# Patient Record
Sex: Male | Born: 1995 | Race: White | Hispanic: No | Marital: Single | Smoking: Never smoker
Health system: Southern US, Community
[De-identification: ages and names within clinical notes are randomized; demographics above are authoritative.]

## PROBLEM LIST (undated history)

## (undated) DIAGNOSIS — E049 Nontoxic goiter, unspecified: Secondary | ICD-10-CM

## (undated) DIAGNOSIS — R51 Headache: Secondary | ICD-10-CM

## (undated) DIAGNOSIS — R6250 Unspecified lack of expected normal physiological development in childhood: Secondary | ICD-10-CM

## (undated) DIAGNOSIS — T7840XA Allergy, unspecified, initial encounter: Secondary | ICD-10-CM

## (undated) DIAGNOSIS — E039 Hypothyroidism, unspecified: Secondary | ICD-10-CM

## (undated) DIAGNOSIS — E300 Delayed puberty: Secondary | ICD-10-CM

## (undated) DIAGNOSIS — E063 Autoimmune thyroiditis: Secondary | ICD-10-CM

## (undated) HISTORY — PX: TONSILECTOMY, ADENOIDECTOMY, BILATERAL MYRINGOTOMY AND TUBES: SHX2538

## (undated) HISTORY — DX: Autoimmune thyroiditis: E06.3

## (undated) HISTORY — DX: Hypothyroidism, unspecified: E03.9

## (undated) HISTORY — DX: Delayed puberty: E30.0

## (undated) HISTORY — DX: Headache: R51

## (undated) HISTORY — DX: Allergy, unspecified, initial encounter: T78.40XA

## (undated) HISTORY — DX: Unspecified lack of expected normal physiological development in childhood: R62.50

## (undated) HISTORY — DX: Nontoxic goiter, unspecified: E04.9

## (undated) HISTORY — PX: ADENOIDECTOMY: SUR15

## (undated) HISTORY — PX: NASAL TURBINATE REDUCTION: SHX2072

---
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
## 2015-02-25 ENCOUNTER — Other Ambulatory Visit: Payer: Self-pay | Admitting: Sports Medicine

## 2015-06-18 DIAGNOSIS — J0100 Acute maxillary sinusitis, unspecified: Secondary | ICD-10-CM | POA: Diagnosis not present

## 2015-07-07 ENCOUNTER — Encounter: Payer: Self-pay | Admitting: Sports Medicine

## 2015-07-07 ENCOUNTER — Ambulatory Visit (INDEPENDENT_AMBULATORY_CARE_PROVIDER_SITE_OTHER): Payer: BLUE CROSS/BLUE SHIELD | Admitting: Sports Medicine

## 2015-07-07 VITALS — BP 111/69 | HR 68 | Temp 97.9°F | Wt 128.0 lb

## 2015-07-07 DIAGNOSIS — H6123 Impacted cerumen, bilateral: Secondary | ICD-10-CM

## 2015-07-07 DIAGNOSIS — J0101 Acute recurrent maxillary sinusitis: Secondary | ICD-10-CM | POA: Diagnosis not present

## 2015-07-07 MED ORDER — PREDNISONE 50 MG PO TABS
50.0000 mg | ORAL_TABLET | Freq: Every day | ORAL | Status: DC
Start: 2015-07-07 — End: 2015-07-15

## 2015-07-07 MED ORDER — AZITHROMYCIN 250 MG PO TABS: ORAL_TABLET | ORAL | Status: DC

## 2015-07-07 NOTE — Assessment & Plan Note (Signed)
Failed Augmentin with recurrence, adding azithromycin and prednisone.

## 2015-07-07 NOTE — Progress Notes (Signed)
  Subjective:    CC: Sinus infection  HPI: This is a pleasant 20 year old male, he was treated approximately 2 weeks ago for maxillary sinusitis with Augmentin, this was after approximately 2 weeks of symptoms, unfortunately had a recurrence, right-sided facial pressure and pain with radiation to the ear. Symptoms are moderate, persistent, no constitutional symptoms.  Past medical history, Surgical history, Family history not pertinant except as noted below, Social history, Allergies, and medications have been entered into the medical record, reviewed, and no changes needed.   Review of Systems: No fevers, chills, night sweats, weight loss, chest pain, or shortness of breath.   Objective:    General: Well Developed, well nourished, and in no acute distress.  Neuro: Alert and oriented x3, extra-ocular muscles intact, sensation grossly intact.  HEENT: Normocephalic, atraumatic, pupils equal round reactive to light, neck supple, no masses, no lymphadenopathy, thyroid nonpalpable. Oropharynx, nasopharynx unremarkable, ear canals shows cerumen impaction bilaterally Skin: Warm and dry, no rashes. Cardiac: Regular rate and rhythm, no murmurs rubs or gallops, no lower extremity edema.  Respiratory: Clear to auscultation bilaterally. Not using accessory muscles, speaking in full sentences.  Indication: Cerumen impaction of the left and right ear(s) Medical necessity statement: On physical examination, cerumen impairs clinically significant portions of the external auditory canal, and tympanic membrane. Noted obstructive, copious cerumen that cannot be removed without magnification and instrumentations requiring physician skills Consent: Discussed benefits and risks of procedure and verbal consent obtained Procedure: Patient was prepped for the procedure. Utilized an otoscope to assess and take note of the ear canal, the tympanic membrane, and the presence, amount, and placement of the cerumen. Gentle  water irrigation and soft plastic curette was utilized to remove cerumen.  Post procedure examination: shows cerumen was completely removed. Patient tolerated procedure well. The patient is made aware that they may experience temporary vertigo, temporary hearing loss, and temporary discomfort. If these symptom last for more than 24 hours to call the clinic or proceed to the ED.  Impression and Recommendations:

## 2015-07-08 ENCOUNTER — Ambulatory Visit: Payer: Self-pay | Admitting: Sports Medicine

## 2015-07-09 ENCOUNTER — Ambulatory Visit: Payer: Self-pay | Admitting: Sports Medicine

## 2015-07-14 ENCOUNTER — Other Ambulatory Visit: Payer: Self-pay | Admitting: Sports Medicine

## 2015-07-15 ENCOUNTER — Ambulatory Visit (INDEPENDENT_AMBULATORY_CARE_PROVIDER_SITE_OTHER): Payer: BLUE CROSS/BLUE SHIELD

## 2015-07-15 ENCOUNTER — Ambulatory Visit (INDEPENDENT_AMBULATORY_CARE_PROVIDER_SITE_OTHER): Payer: BLUE CROSS/BLUE SHIELD | Admitting: Sports Medicine

## 2015-07-15 ENCOUNTER — Encounter: Payer: Self-pay | Admitting: Sports Medicine

## 2015-07-15 DIAGNOSIS — J0101 Acute recurrent maxillary sinusitis: Secondary | ICD-10-CM | POA: Diagnosis not present

## 2015-07-15 DIAGNOSIS — J321 Chronic frontal sinusitis: Secondary | ICD-10-CM

## 2015-07-15 DIAGNOSIS — E038 Other specified hypothyroidism: Secondary | ICD-10-CM | POA: Diagnosis not present

## 2015-07-15 DIAGNOSIS — J329 Chronic sinusitis, unspecified: Secondary | ICD-10-CM | POA: Diagnosis not present

## 2015-07-15 DIAGNOSIS — E063 Autoimmune thyroiditis: Secondary | ICD-10-CM

## 2015-07-15 MED ORDER — TRAMADOL HCL 50 MG PO TABS: ORAL_TABLET | ORAL | Status: DC

## 2015-07-15 NOTE — Assessment & Plan Note (Signed)
Rechecking TSH 

## 2015-07-15 NOTE — Assessment & Plan Note (Signed)
Persistent symptoms now despite steroids, Augmentin, azithromycin, we need to confirm the diagnosis now, CT maxillofacial. I'm also going to give him some tramadol for pain relief. Interestingly he felt no better on the prednisone. I do suspect we will need referral to ENT.

## 2015-07-15 NOTE — Progress Notes (Signed)
  Subjective:    CC: Facial pain  HPI: For the past several months this pleasant 20 year old male has had pain that he localizes on both maxillary sinuses, frontal sinuses, we have done a course of Augmentin, and subsequently prednisone and azithromycin without any improvement in his symptoms at all, not even on the prednisone. On further questioning he does get some nausea with his facial pain, it is not throbbing and it lasts for hours to days, no photophobia or phonophobia, no aura. Symptoms are recurrent, severe.  Past medical history, Surgical history, Family history not pertinant except as noted below, Social history, Allergies, and medications have been entered into the medical record, reviewed, and no changes needed.   Review of Systems: No fevers, chills, night sweats, weight loss, chest pain, or shortness of breath.   Objective:    General: Well Developed, well nourished, and in no acute distress.  Neuro: Alert and oriented x3, extra-ocular muscles intact, sensation grossly intact. Cranial nerves II through XII are intact, motor, sensory, and coordinative functions are all intact.  HEENT: Normocephalic, atraumatic, pupils equal round reactive to light, neck supple, no masses, no lymphadenopathy, thyroid nonpalpable.  Skin: Warm and dry, no rashes. Cardiac: Regular rate and rhythm, no murmurs rubs or gallops, no lower extremity edema.  Respiratory: Clear to auscultation bilaterally. Not using accessory muscles, speaking in full sentences.  Impression and Recommendations:    I spent 25 minutes with this patient, greater than 50% was face-to-face time counseling regarding the above diagnoses

## 2015-07-16 ENCOUNTER — Encounter: Payer: Self-pay | Admitting: Sports Medicine

## 2015-07-16 DIAGNOSIS — J0110 Acute frontal sinusitis, unspecified: Secondary | ICD-10-CM

## 2015-07-16 LAB — TSH: TSH: 1.38 mIU/L (ref 0.40–4.50)

## 2015-07-16 MED ORDER — LEVOFLOXACIN 750 MG PO TABS: 750.0000 mg | ORAL_TABLET | Freq: Every day | ORAL | Status: DC

## 2015-07-16 MED ORDER — PREDNISONE 10 MG (21) PO TBPK: ORAL_TABLET | ORAL | Status: DC

## 2015-07-16 NOTE — Assessment & Plan Note (Signed)
CT shows a right frontal sinusitis, as well as deviation of the nasal septum, At this point we are going to try levofloxacin, a Dosepak of prednisone, and referral to ENT. Patient needs to get a copy of his CT disc and report to take to his ENT appointment.

## 2015-08-18 DIAGNOSIS — J3489 Other specified disorders of nose and nasal sinuses: Secondary | ICD-10-CM | POA: Diagnosis not present

## 2015-08-18 DIAGNOSIS — J302 Other seasonal allergic rhinitis: Secondary | ICD-10-CM | POA: Diagnosis not present

## 2015-08-18 DIAGNOSIS — J321 Chronic frontal sinusitis: Secondary | ICD-10-CM | POA: Diagnosis not present

## 2015-12-29 ENCOUNTER — Other Ambulatory Visit: Payer: Self-pay | Admitting: Sports Medicine

## 2015-12-29 DIAGNOSIS — E063 Autoimmune thyroiditis: Secondary | ICD-10-CM

## 2016-10-25 ENCOUNTER — Other Ambulatory Visit: Payer: Self-pay | Admitting: Sports Medicine

## 2017-01-03 DIAGNOSIS — J342 Deviated nasal septum: Secondary | ICD-10-CM | POA: Diagnosis not present

## 2017-01-03 DIAGNOSIS — J320 Chronic maxillary sinusitis: Secondary | ICD-10-CM | POA: Diagnosis not present

## 2017-01-03 DIAGNOSIS — J302 Other seasonal allergic rhinitis: Secondary | ICD-10-CM | POA: Diagnosis not present

## 2017-02-04 DIAGNOSIS — J342 Deviated nasal septum: Secondary | ICD-10-CM | POA: Diagnosis not present

## 2017-02-04 DIAGNOSIS — J320 Chronic maxillary sinusitis: Secondary | ICD-10-CM | POA: Diagnosis not present

## 2017-02-20 DIAGNOSIS — R112 Nausea with vomiting, unspecified: Secondary | ICD-10-CM | POA: Diagnosis not present

## 2017-02-20 DIAGNOSIS — M791 Myalgia, unspecified site: Secondary | ICD-10-CM | POA: Diagnosis not present

## 2017-02-22 ENCOUNTER — Ambulatory Visit: Payer: BLUE CROSS/BLUE SHIELD | Admitting: Physician Assistant

## 2017-02-22 ENCOUNTER — Encounter: Payer: Self-pay | Admitting: Sports Medicine

## 2017-02-22 ENCOUNTER — Ambulatory Visit (INDEPENDENT_AMBULATORY_CARE_PROVIDER_SITE_OTHER): Payer: BLUE CROSS/BLUE SHIELD | Admitting: Sports Medicine

## 2017-02-22 DIAGNOSIS — J111 Influenza due to unidentified influenza virus with other respiratory manifestations: Secondary | ICD-10-CM | POA: Insufficient documentation

## 2017-02-22 DIAGNOSIS — R69 Illness, unspecified: Secondary | ICD-10-CM

## 2017-02-22 LAB — POCT INFLUENZA A/B
Influenza A, POC: NEGATIVE
Influenza B, POC: NEGATIVE

## 2017-02-22 MED ORDER — OSELTAMIVIR PHOSPHATE 75 MG PO CAPS
75.0000 mg | ORAL_CAPSULE | Freq: Two times a day (BID) | ORAL | 0 refills | Status: DC
Start: 2017-02-22 — End: 2017-08-09

## 2017-02-22 NOTE — Addendum Note (Signed)
Addended by: Nancy FetterRAWFORD, Leonardo Plaia J on: 02/22/2017 02:55 PM   Modules accepted: Orders

## 2017-02-22 NOTE — Assessment & Plan Note (Signed)
DayQuil/NyQuil, Tamiflu.

## 2017-02-22 NOTE — Patient Instructions (Signed)

## 2017-02-22 NOTE — Progress Notes (Signed)
  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  Just over 2 days ago he started to feel sore throat, fevers and chills, muscle aches, body aches, mild nausea vomiting, no diarrhea.  No skin rash.  Multiple sick contacts in the dorm room.  Symptoms are moderate, persistent, no chest pain, no shortness of breath.  I reviewed the past medical history, family history, social history, surgical history, and allergies today and no changes were needed.  Please see the problem list section below in epic for further details.  Past Medical History: Past Medical History:  Diagnosis Date  . Allergy   . Goiter   . Headache(784.0)   . Hypothyroid   . Hypothyroidism, acquired, autoimmune   . Migraine   . Physical growth delay   . Puberty delay   . Thyroiditis, autoimmune    Past Surgical History: Past Surgical History:  Procedure Laterality Date  . ADENOIDECTOMY    . NASAL TURBINATE REDUCTION    . TONSILECTOMY, ADENOIDECTOMY, BILATERAL MYRINGOTOMY AND TUBES     Social History: Social History   Socioeconomic History  . Marital status: Single    Spouse name: None  . Number of children: None  . Years of education: None  . Highest education level: None  Social Needs  . Financial resource strain: None  . Food insecurity - worry: None  . Food insecurity - inability: None  . Transportation needs - medical: None  . Transportation needs - non-medical: None  Occupational History  . None  Tobacco Use  . Smoking status: Never Smoker  . Smokeless tobacco: Never Used  Substance and Sexual Activity  . Alcohol use: No  . Drug use: None  . Sexual activity: No  Other Topics Concern  . None  Social History Narrative  . None   Family History: Family History  Problem Relation Age of Onset  . Hypothyroidism Mother   . Migraines Mother   . Allergies Mother   . Cancer Mother   . Cancer Father   . Allergies Father     . Hypothyroidism Sister   . Migraines Sister   . Allergies Sister   . Thyroid disease Sister   . Thyroid disease Maternal Aunt   . Cancer Maternal Grandmother   . Thyroid disease Maternal Grandfather    Allergies: Allergies  Allergen Reactions  . Cyproheptadine    Medications: See med rec.  Review of Systems: No fevers, chills, night sweats, weight loss, chest pain, or shortness of breath.   Objective:    General: Well Developed, well nourished, and in no acute distress.  Neuro: Alert and oriented x3, extra-ocular muscles intact, sensation grossly intact.  HEENT: Normocephalic, atraumatic, pupils equal round reactive to light, neck supple, no masses, no lymphadenopathy, thyroid nonpalpable.  Oropharynx, nasopharynx, ear canals unremarkable. Skin: Warm and dry, no rashes. Cardiac: Regular rate and rhythm, no murmurs rubs or gallops, no lower extremity edema.  Respiratory: Clear to auscultation bilaterally. Not using accessory muscles, speaking in full sentences.  Impression and Recommendations:    Influenza-like illness DayQuil/NyQuil, Tamiflu.  I spent 25 minutes with this patient, greater than 50% was face-to-face time counseling regarding the above diagnoses ___________________________________________ Ihor Austinhomas J. Benjamin Stainhekkekandam, M.D., ABFM., CAQSM. Primary Care and Sports Medicine [place] [facility]  Adjunct Instructor of Family Medicine  [employer]

## 2017-03-27 IMAGING — CT CT PARANASAL SINUSES LIMITED
1 series · 10 of 12 positions shown, 13 images · non-contrast
Comparison: None.

CLINICAL DATA: Chronic sinusitis.

EXAM:
CT PARANASAL SINUS LIMITED WITHOUT CONTRAST
TECHNIQUE: Non-contiguous multidetector CT images of the paranasal sinuses were
obtained in a single plane without contrast.

[Series 3: limited sinus st · axial · 0.27mm/px · z∈[-83,+7]mm · 10 of 12 slices shown, 13 images]
[im 2/12  brain]
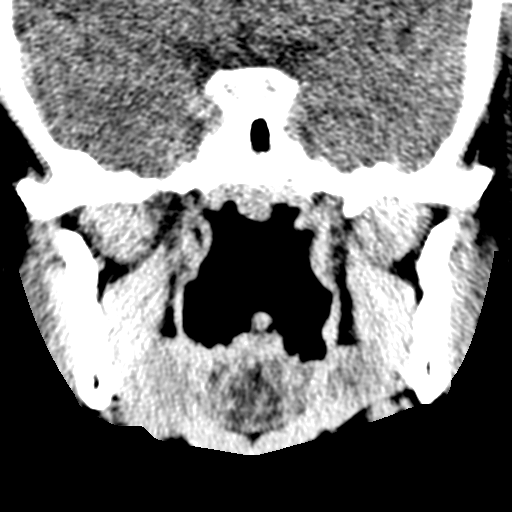
[im 2/12  bone]
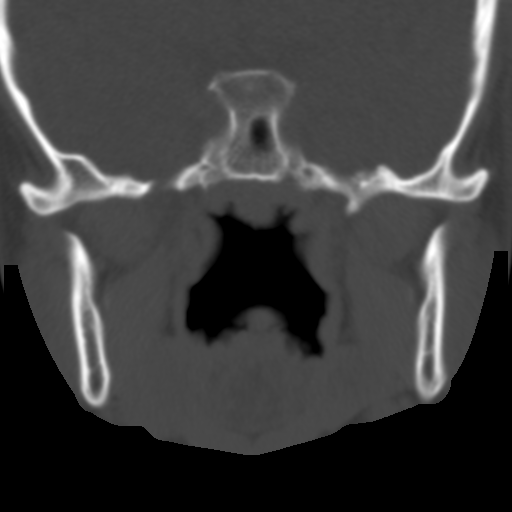
[im 3/12  bone]
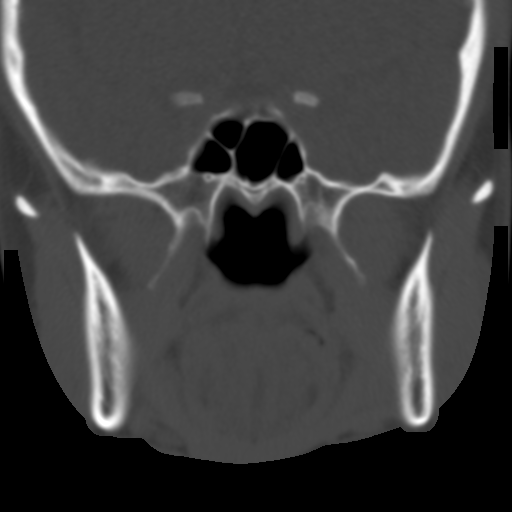
[im 4/12  bone]
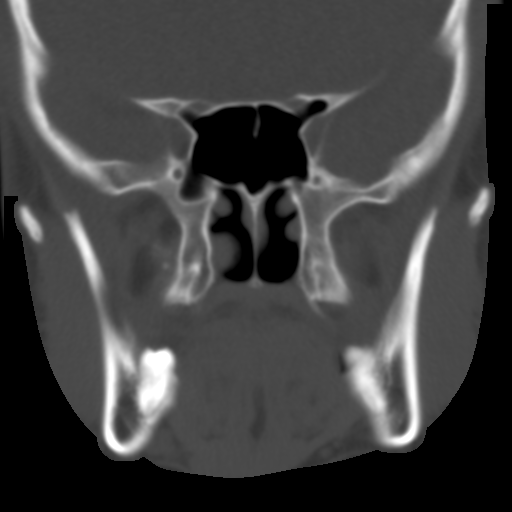
[im 5/12  bone]
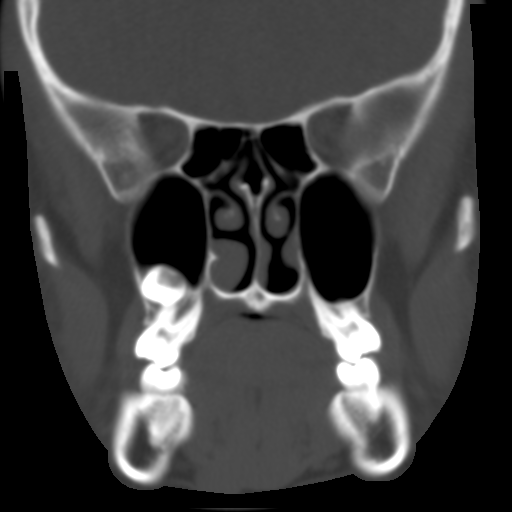
[im 6/12  brain]
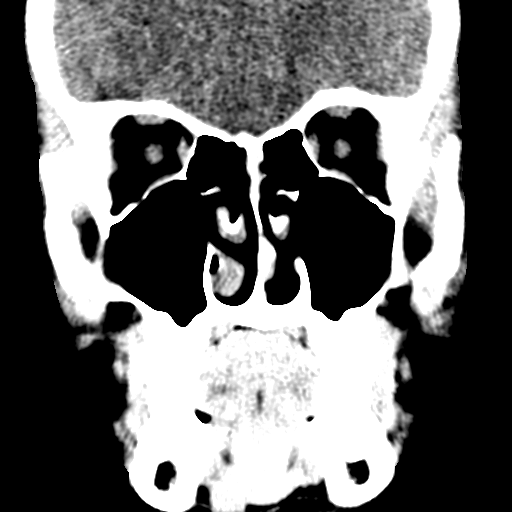
[im 6/12  bone]
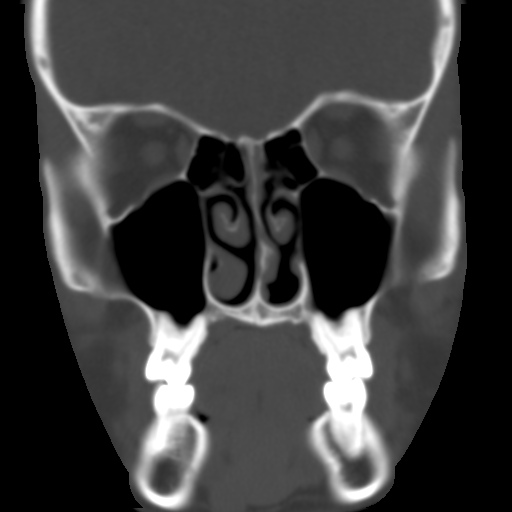
[im 7/12  bone]
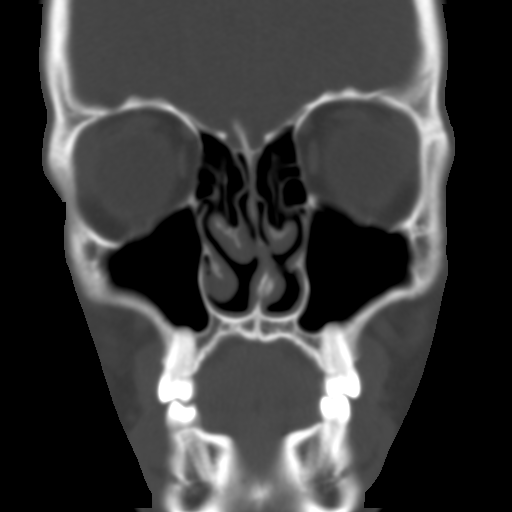
[im 8/12  bone]
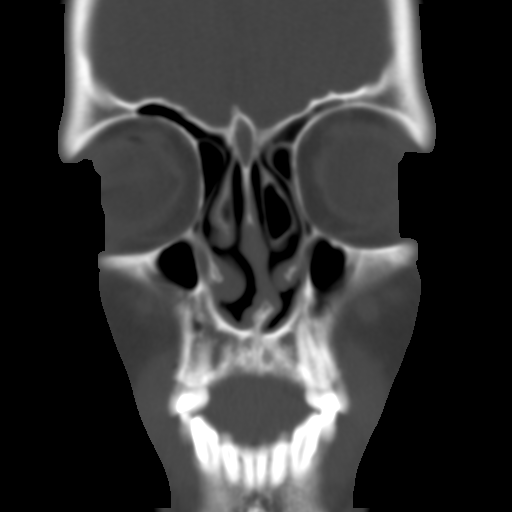
[im 9/12  bone]
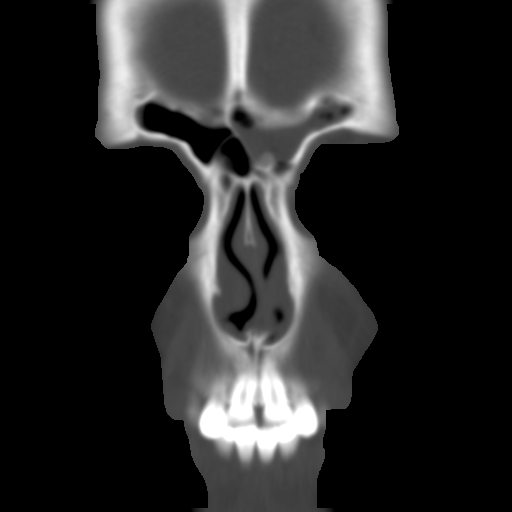
[im 10/12  brain]
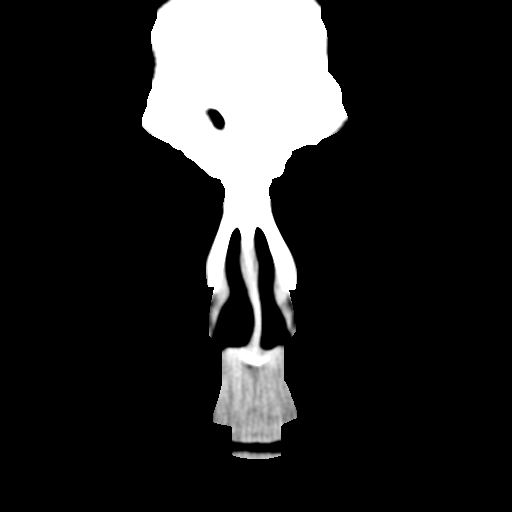
[im 10/12  bone]
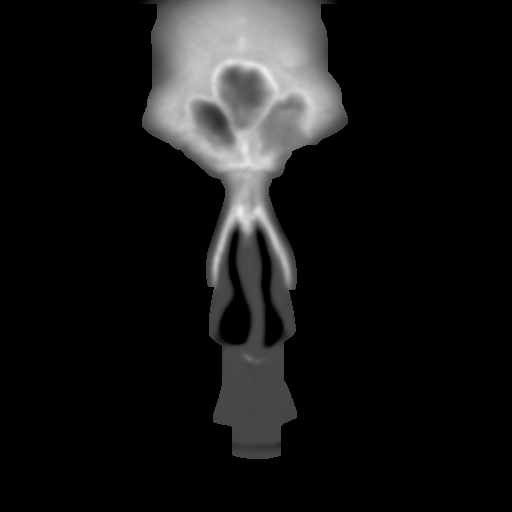
[im 11/12  bone]
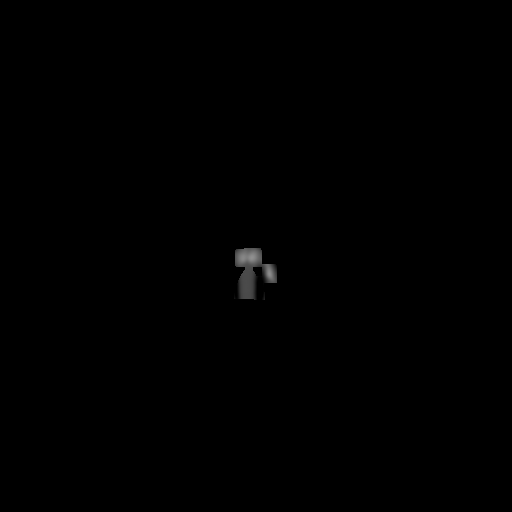

[10 of 12 positions shown; findings below may reference images not displayed]

FINDINGS: Right frontal sinusitis is noted. Remaining paranasal sinuses appear
normal. Globes and orbits appear normal. Mild deviation of bony
nasal septum to the right is noted.
IMPRESSION: Right frontal sinusitis.

## 2017-05-02 ENCOUNTER — Other Ambulatory Visit: Payer: Self-pay | Admitting: Sports Medicine

## 2017-08-09 ENCOUNTER — Encounter: Payer: Self-pay | Admitting: Sports Medicine

## 2017-08-09 ENCOUNTER — Ambulatory Visit (INDEPENDENT_AMBULATORY_CARE_PROVIDER_SITE_OTHER): Payer: BLUE CROSS/BLUE SHIELD | Admitting: Sports Medicine

## 2017-08-09 ENCOUNTER — Ambulatory Visit (INDEPENDENT_AMBULATORY_CARE_PROVIDER_SITE_OTHER): Payer: BLUE CROSS/BLUE SHIELD

## 2017-08-09 DIAGNOSIS — S93402A Sprain of unspecified ligament of left ankle, initial encounter: Secondary | ICD-10-CM | POA: Insufficient documentation

## 2017-08-09 DIAGNOSIS — S93492A Sprain of other ligament of left ankle, initial encounter: Secondary | ICD-10-CM

## 2017-08-09 DIAGNOSIS — X58XXXA Exposure to other specified factors, initial encounter: Secondary | ICD-10-CM | POA: Diagnosis not present

## 2017-08-09 DIAGNOSIS — Z0000 Encounter for general adult medical examination without abnormal findings: Secondary | ICD-10-CM

## 2017-08-09 DIAGNOSIS — E063 Autoimmune thyroiditis: Secondary | ICD-10-CM | POA: Diagnosis not present

## 2017-08-09 NOTE — Progress Notes (Signed)
Subjective:    CC: Ankle pain  HPI: Paul [name] is a pleasant 22 year old male, for the past several months he has had several inversion injuries of his left ankle, it swells often, and at this point he cannot run or jump without significant pain.  Symptoms are moderate, persistent, localized to the lateral ankle without radiation.  Hypothyroidism: Due to TFTs.  I reviewed the past medical history, family history, social history, surgical history, and allergies today and no changes were needed.  Please see the problem list section below in epic for further details.  Past Medical History: Past Medical History:  Diagnosis Date  . Allergy   . Goiter   . Headache(784.0)   . Hypothyroid   . Hypothyroidism, acquired, autoimmune   . Migraine   . Physical growth delay   . Puberty delay   . Thyroiditis, autoimmune    Past Surgical History: Past Surgical History:  Procedure Laterality Date  . ADENOIDECTOMY    . NASAL TURBINATE REDUCTION    . TONSILECTOMY, ADENOIDECTOMY, BILATERAL MYRINGOTOMY AND TUBES     Social History: Social History   Socioeconomic History  . Marital status: Single    Spouse name: Not on file  . Number of children: Not on file  . Years of education: Not on file  . Highest education level: Not on file  Occupational History  . Not on file  Social Needs  . Financial resource strain: Not on file  . Food insecurity:    Worry: Not on file    Inability: Not on file  . Transportation needs:    Medical: Not on file    Non-medical: Not on file  Tobacco Use  . Smoking status: Never Smoker  . Smokeless tobacco: Never Used  Substance and Sexual Activity  . Alcohol use: No  . Drug use: Not on file  . Sexual activity: Never  Lifestyle  . Physical activity:    Days per week: Not on file    Minutes per session: Not on file  . Stress: Not on file  Relationships  . Social connections:    Talks on phone: Not on file    Gets together: Not on file    Attends  religious service: Not on file    Active member of club or organization: Not on file    Attends meetings of clubs or organizations: Not on file    Relationship status: Not on file  Other Topics Concern  . Not on file  Social History Narrative  . Not on file   Family History: Family History  Problem Relation Age of Onset  . Hypothyroidism Mother   . Migraines Mother   . Allergies Mother   . Cancer Mother   . Cancer Father   . Allergies Father   . Hypothyroidism Sister   . Migraines Sister   . Allergies Sister   . Thyroid disease Sister   . Thyroid disease Maternal Aunt   . Cancer Maternal Grandmother   . Thyroid disease Maternal Grandfather    Allergies: Allergies  Allergen Reactions  . Cyproheptadine    Medications: See med rec.  Review of Systems: No fevers, chills, night sweats, weight loss, chest pain, or shortness of breath.   Objective:    General: Well Developed, well nourished, and in no acute distress.  Neuro: Alert and oriented x3, extra-ocular muscles intact, sensation grossly intact.  HEENT: Normocephalic, atraumatic, pupils equal round reactive to light, neck supple, no masses, no lymphadenopathy, thyroid nonpalpable.  Skin: Warm  and dry, no rashes. Cardiac: Regular rate and rhythm, no murmurs rubs or gallops, no lower extremity edema.  Respiratory: Clear to auscultation bilaterally. Not using accessory muscles, speaking in full sentences. Left ankle: No visible erythema or swelling. Range of motion is full in all directions. Strength is 5/5 in all directions. Stable lateral and medial ligaments; squeeze test and kleiger test unremarkable; Talar dome nontender; No pain at base of 5th MT; No tenderness over cuboid; No tenderness over N spot or navicular prominence No tenderness on posterior aspects of lateral and medial malleolus Tender over the ATFL and CFL. No sign of peroneal tendon subluxations; Negative tarsal tunnel tinel's Able to walk 4  steps.  Impression and Recommendations:    Sprain of ankle, left Recurrent lateral ankle sprains, ASO, physical therapy, x-rays. Return in 1 month, MRI if no better.  Hypothyroidism, acquired, autoimmune Rechecking TFTs  Health maintenance examination Adding routine labs ___________________________________________ Ihor Austinhomas J. Benjamin Stainhekkekandam, M.D., ABFM., CAQSM. Primary Care and Sports Medicine [facility]  Adjunct Instructor of Family Medicine  [employer]

## 2017-08-09 NOTE — Assessment & Plan Note (Signed)
Adding routine labs 

## 2017-08-09 NOTE — Assessment & Plan Note (Signed)
Rechecking TFTs

## 2017-08-09 NOTE — Assessment & Plan Note (Signed)
Recurrent lateral ankle sprains, ASO, physical therapy, x-rays. Return in 1 month, MRI if no better.

## 2017-08-15 DIAGNOSIS — Z0000 Encounter for general adult medical examination without abnormal findings: Secondary | ICD-10-CM | POA: Diagnosis not present

## 2017-08-15 DIAGNOSIS — E063 Autoimmune thyroiditis: Secondary | ICD-10-CM | POA: Diagnosis not present

## 2017-08-15 DIAGNOSIS — Z131 Encounter for screening for diabetes mellitus: Secondary | ICD-10-CM | POA: Diagnosis not present

## 2017-08-16 LAB — COMPREHENSIVE METABOLIC PANEL
AG Ratio: 2.1 (calc) (ref 1.0–2.5)
ALT: 14 U/L (ref 9–46)
AST: 19 U/L (ref 10–40)
Albumin: 4.9 g/dL (ref 3.6–5.1)
Alkaline phosphatase (APISO): 64 U/L (ref 40–115)
BUN: 16 mg/dL (ref 7–25)
CO2: 28 mmol/L (ref 20–32)
Calcium: 9.9 mg/dL (ref 8.6–10.3)
Chloride: 104 mmol/L (ref 98–110)
Creat: 0.86 mg/dL (ref 0.60–1.35)
Globulin: 2.3 g/dL (calc) (ref 1.9–3.7)
Glucose, Bld: 96 mg/dL (ref 65–139)
Potassium: 4.7 mmol/L (ref 3.5–5.3)
Sodium: 138 mmol/L (ref 135–146)
Total Bilirubin: 1 mg/dL (ref 0.2–1.2)
Total Protein: 7.2 g/dL (ref 6.1–8.1)

## 2017-08-16 LAB — LIPID PANEL W/REFLEX DIRECT LDL
Cholesterol: 194 mg/dL (ref <200)
HDL: 71 mg/dL (ref >40)
LDL Cholesterol (Calc): 106 mg/dL (calc) — ABNORMAL HIGH
Non-HDL Cholesterol (Calc): 123 mg/dL (calc) (ref <130)
Total CHOL/HDL Ratio: 2.7 (calc) (ref <5.0)
Triglycerides: 80 mg/dL (ref <150)

## 2017-08-16 LAB — CBC
HCT: 36.9 % — ABNORMAL LOW (ref 38.5–50.0)
Hemoglobin: 12.8 g/dL — ABNORMAL LOW (ref 13.2–17.1)
MCH: 30.3 pg (ref 27.0–33.0)
MCHC: 34.7 g/dL (ref 32.0–36.0)
MCV: 87.4 fL (ref 80.0–100.0)
MPV: 9.1 fL (ref 7.5–12.5)
Platelets: 247 10*3/uL (ref 140–400)
RBC: 4.22 10*6/uL (ref 4.20–5.80)
RDW: 12.7 % (ref 11.0–15.0)
WBC: 5.7 10*3/uL (ref 3.8–10.8)

## 2017-08-16 LAB — HEMOGLOBIN A1C
Hgb A1c MFr Bld: 5.3 % of total Hgb (ref <5.7)
Mean Plasma Glucose: 105 (calc)
eAG (mmol/L): 5.8 (calc)

## 2017-08-16 LAB — HIV ANTIBODY (ROUTINE TESTING W REFLEX): HIV 1&2 Ab, 4th Generation: NONREACTIVE

## 2017-08-16 LAB — TSH: TSH: 1.05 mIU/L (ref 0.40–4.50)

## 2017-08-18 ENCOUNTER — Ambulatory Visit (INDEPENDENT_AMBULATORY_CARE_PROVIDER_SITE_OTHER): Payer: BLUE CROSS/BLUE SHIELD | Admitting: Rehabilitative and Restorative Service Providers"

## 2017-08-18 ENCOUNTER — Encounter: Payer: Self-pay | Admitting: Rehabilitative and Restorative Service Providers"

## 2017-08-18 DIAGNOSIS — M6281 Muscle weakness (generalized): Secondary | ICD-10-CM

## 2017-08-18 DIAGNOSIS — M25572 Pain in left ankle and joints of left foot: Secondary | ICD-10-CM

## 2017-08-18 DIAGNOSIS — R29898 Other symptoms and signs involving the musculoskeletal system: Secondary | ICD-10-CM

## 2017-08-18 NOTE — Therapy (Signed)
[facility] [address] [place], [place], [address] Phone: [phone]   Fax:  [phone]  Physical Therapy Evaluation  Patient Details  Name: Paul DameRobert Allen [name] MRN: [id number] Date of Birth: 12/07/1995 Referring Provider: Dr Benjamin Stainhekkekandam   Encounter Date: 08/18/2017  PT End of Session - 08/18/17 1133    Visit Number  1    Number of Visits  12    Date for PT Re-Evaluation  09/28/17    PT Start Time  1021    PT Stop Time  1124    PT Time Calculation (min)  63 min    Activity Tolerance  Patient tolerated treatment well       Past Medical History:  Diagnosis Date  . Allergy   . Goiter   . Headache(784.0)   . Hypothyroid   . Hypothyroidism, acquired, autoimmune   . Migraine   . Physical growth delay   . Puberty delay   . Thyroiditis, autoimmune     Past Surgical History:  Procedure Laterality Date  . ADENOIDECTOMY    . NASAL TURBINATE REDUCTION    . TONSILECTOMY, ADENOIDECTOMY, BILATERAL MYRINGOTOMY AND TUBES      There were no vitals filed for this visit.   Subjective Assessment - 08/18/17 1029    Subjective  Patient reports that he has had ~ 4 Lt ankle sprains with the initial injury ~ 4 years ago and most recent late April. He has not been able to return to full activity level - specifically running, jumping, playing basketball due to pain.     Patient Stated Goals  get ankle back to full activity level     Currently in Pain?  No/denies    Pain Score  0-No pain    Pain Location  Ankle    Pain Orientation  Left    Pain Type  Chronic pain    Pain Onset  More than a month ago    Pain Frequency  Intermittent    Aggravating Factors   running, jumping, basketball, plant and turn, softball, squatting, walking/running on inclines     Pain Relieving Factors  time, rest          [facility] PT Assessment - 08/18/17 0001      Assessment   Medical Diagnosis  Recurrent Lt ankle sprains    Referring Provider   Dr Benjamin Stainhekkekandam    Onset Date/Surgical Date  05/02/17 initial sprain ~ 4 yrs ago     Hand Dominance  Right    Next MD Visit  1 month after PT     Prior Therapy  none       Precautions   Precautions  None      Restrictions   Weight Bearing Restrictions  No      Balance Screen   Has the patient fallen in the past 6 months  No    Has the patient had a decrease in activity level because of a fear of falling?   No    Is the patient reluctant to leave their home because of a fear of falling?   No      Prior Function   Level of Independence  Independent    Vocation  Full time employment;Student internship with [employer] finishes 09/04/17    Vocation Requirements  marketing and sports marketing     Leisure  golf; basketball; softball; yard work       Observation/Other Assessments   Focus on Therapeutic Outcomes (FOTO)  405 limitation       Posture/Postural Control   Posture Comments  head forward; shoulders rounded       AROM   Right/Left Hip  -- WNL's bilat     Right/Left Knee  -- WNL's bilat     Right Ankle Dorsiflexion  15    Right Ankle Plantar Flexion  54    Right Ankle Inversion  25    Right Ankle Eversion  13    Left Ankle Dorsiflexion  6    Left Ankle Plantar Flexion  45    Left Ankle Inversion  18    Left Ankle Eversion  15      Strength   Left Hip ABduction  4+/5    Right Ankle Dorsiflexion  5/5    Right Ankle Plantar Flexion  5/5    Right Ankle Inversion  5/5    Right Ankle Eversion  5/5    Left Ankle Dorsiflexion  -- 5-/5    Left Ankle Plantar Flexion  4+/5    Left Ankle Inversion  5/5    Left Ankle Eversion  -- 5-/5      Flexibility   Hamstrings  Rt tight 50 deg; Lt 55 deg     Quadriceps  WNL    ITB  WNL    Piriformis  WNL       Palpation   Palpation comment  tender Lt lateral ankle - anterior talofibular ligament                 Objective measurements completed on examination: See above findings.      [facility] Adult PT Treatment/Exercise -  08/18/17 0001      Vasopneumatic   Number Minutes Vasopneumatic   15 minutes    Vasopnuematic Location   Ankle Lt    Vasopneumatic Pressure  Medium    Vasopneumatic Temperature   34 deg       Ankle Exercises: Stretches   Soleus Stretch  2 reps;30 seconds    Gastroc Stretch  2 reps;20 seconds    Other Stretch  hamstring stretch 30 sec x 2 reps pt supine with strap       Ankle Exercises: Standing   SLS  30 sec x 3 reps each LE       Ankle Exercises: Supine   T-Band  4 way band 10 reps slow; 10 reps fast red TB (double for DF/PF; single for inv/eve              PT Education - 08/18/17 1117    Education Details  HEP    Person(s) Educated  Patient    Methods  Explanation;Demonstration;Tactile cues;Verbal cues;Handout    Comprehension  Verbalized understanding;Returned demonstration;Verbal cues required;Tactile cues required          PT Long Term Goals - 08/18/17 1221      PT LONG TERM GOAL #1   Title  5/5 strength Lt hip abductors and Lt ankle 09/28/17    Time  6    Period  Weeks    Status  New      PT LONG TERM GOAL #2   Title  Improve balance and proprioception with Rob to demonstrate SLS bilat for 20-30 sec without UE support 09/28/17    Time  6    Period  Weeks    Status  New      PT LONG TERM GOAL #3   Title  Independent in HEP 09/28/17    Time  6  Period  Weeks    Status  New      PT LONG TERM GOAL #4   Title  Rob reports return to sports with minimal to no limitations or pain 09/28/17    Time  6    Period  Weeks    Status  New      PT LONG TERM GOAL #5   Title  Improve FOTO to </= 21% limitation 09/28/17    Time  6    Period  Weeks    Status  New             Plan - 08/18/17 1134    Clinical Impression Statement  Rob presents with recurrent Lt ankle sprain with most recent sprain April 2019. He has continued pain and limitations with higher level activities and sports. He has limited Lt ankle ROM and strength as well as balance and  functional activities. Patient will benefit form PT to address problems identified.     Clinical Presentation  Stable    Clinical Decision Making  Low    Rehab Potential  Good    PT Frequency  2x / week    PT Duration  6 weeks    PT Treatment/Interventions  Patient/family education;ADLs/Self Care Home Management;Cryotherapy;Electrical Stimulation;Iontophoresis 4mg /ml Dexamethasone;Moist Heat;Ultrasound;Dry needling;Manual techniques;Neuromuscular re-education;Therapeutic activities;Therapeutic exercise;Balance training    PT Next Visit Plan  review HEP; progress with ROM/strengthening/balance/proprioception/sports specific activities - (add Lt hip abductor strengthening) modalities as indicated     Consulted and Agree with Plan of Care  Patient       Patient will benefit from skilled therapeutic intervention in order to improve the following deficits and impairments:  Decreased mobility, Decreased range of motion, Decreased strength, Decreased activity tolerance  Visit Diagnosis: Pain in left ankle and joints of left foot - Plan: PT plan of care cert/re-cert  Muscle weakness (generalized) - Plan: PT plan of care cert/re-cert  Other symptoms and signs involving the musculoskeletal system - Plan: PT plan of care cert/re-cert     Problem List Patient Active Problem List   Diagnosis Date Noted  . Sprain of ankle, left 08/09/2017  . Physical growth delay   . Hypothyroidism, acquired, autoimmune   . Multiple respiratory allergies 11/21/2010  . Environmental allergies 11/21/2010  . Migraine headache 11/21/2010  . Health maintenance examination 11/21/2010    Kamila Broda Rober Minion PT, MPH  08/18/2017, 12:26 PM  [facility] [address] [place], [place], [address] Phone: [phone]   Fax:  [phone]  Name: Shimshon Narula MRN: [id number] Date of Birth: January 29, 1995

## 2017-08-18 NOTE — Patient Instructions (Addendum)
HIP: Hamstrings - Supine  Place strap around foot. Raise leg up, keeping knee straight.  Bend opposite knee to protect back if indicated. Hold 30 seconds. 3 reps per set, 2-3 sets per day  Gastroc Stretch    Stand with right foot back, leg straight, forward leg bent. Keeping heel on floor, turned slightly out, lean into wall until stretch is felt in calf. Hold __30__ seconds. Repeat __3__ times per set. Do __2-3__ sessions per day.   Achilles / Soleus, Standing    Stand, right foot behind, heel on floor and turned slightly out. Lower hips and bend knees. Hold _30__ seconds. Repeat _3__ times per session. Do _2-3__ sessions per day.   Dorsiflexion: Resisted    Facing anchor, tubing around left foot, pull toward face.  Repeat ____ times per set. Do ____ sets per session. Do ____ sessions per day.   Eversion: Resisted    With right foot in tubing loop, hold tubing around other foot to resist and turn foot out. Repeat ____ times per set. Do ____ sets per session. Do ____ sessions per day.    Inversion: Resisted    Cross legs with right leg underneath, foot in tubing loop. Hold tubing around other foot to resist and turn foot in. Repeat ____ times per set. Do ____ sets per session. Do ____ sessions per day.   .  Plantar Flexion: Resisted    Anchor behind, tubing around left foot, press down. Repeat ____ times per set. Do ____ sets per session. Do ____ sessions per day.   Balance: Unilateral    Attempt to balance on left leg, eyes open. Hold __30__ seconds. Repeat _4-5___ times per set.  Do __2-3__ sessions per day. Perform exercise with eyes closed.

## 2017-08-23 ENCOUNTER — Ambulatory Visit (INDEPENDENT_AMBULATORY_CARE_PROVIDER_SITE_OTHER): Payer: BLUE CROSS/BLUE SHIELD | Admitting: Physical Therapy

## 2017-08-23 DIAGNOSIS — M6281 Muscle weakness (generalized): Secondary | ICD-10-CM

## 2017-08-23 DIAGNOSIS — M25572 Pain in left ankle and joints of left foot: Secondary | ICD-10-CM | POA: Diagnosis not present

## 2017-08-23 DIAGNOSIS — R29898 Other symptoms and signs involving the musculoskeletal system: Secondary | ICD-10-CM

## 2017-08-23 NOTE — Patient Instructions (Signed)
ANKLE: Eversion, Bilateral (Band)    Place band around feet. Keeping heels on floor, raise toes of both feet up and away from body. Do not move hips. Hold _2__ seconds. Use __blue______ band. __10_ reps per set, _2__ sets per day  * trim/ shave ankle

## 2017-08-23 NOTE — Therapy (Signed)
[address] [place], [place], [address] Phone: [phone]   Fax:  [phone]  Physical Therapy Treatment  Patient Details  Name: Paul [name] MRN: [id number] Date of Birth: [birth year] Referring Provider: Dr. Dianah Field   Encounter Date: 08/23/2017  PT End of Session - 08/23/17 1149    Visit Number  2    Number of Visits  12    Date for PT Re-Evaluation  09/28/17    PT Start Time  8003    PT Stop Time  4917    PT Time Calculation (min)  43 min    Activity Tolerance  Patient tolerated treatment well;No increased pain    Behavior During Therapy  WFL for tasks assessed/performed       Past Medical History:  Diagnosis Date  . Allergy   . Goiter   . Headache(784.0)   . Hypothyroid   . Hypothyroidism, acquired, autoimmune   . Migraine   . Physical growth delay   . Puberty delay   . Thyroiditis, autoimmune     Past Surgical History:  Procedure Laterality Date  . ADENOIDECTOMY    . NASAL TURBINATE REDUCTION    . TONSILECTOMY, ADENOIDECTOMY, BILATERAL MYRINGOTOMY AND TUBES      There were no vitals filed for this visit.  Subjective Assessment - 08/23/17 1110    Subjective  Pt reports he feels the exercises are helping.  Leg fatigues quickly, "like my ankle will just give".  However he feels no significant difference with running or jumping (up to 5/10).      Patient Stated Goals  get ankle back to full activity level     Currently in Pain?  No/denies    Pain Score  0-No pain    Pain Orientation  Left         [facility] PT Assessment - 08/23/17 0001      Assessment   Medical Diagnosis  Recurrent Lt ankle sprains    Referring Provider  Dr. Dianah Field    Onset Date/Surgical Date  05/02/17 initial sprain ~ 4 yrs ago        [facility] Adult PT Treatment/Exercise - 08/23/17 0001      Modalities   Modalities  -- pt declined, will ice at home if needed.       Manual Therapy   Manual Therapy   Soft tissue mobilization;Joint mobilization    Joint Mobilization  Grade I, II talocrural joint mobs.     Soft tissue mobilization  IASTM to Lt anterior/lateral ankle wiht Edge tool to decrease fascial restrictions and improve ROM.        Ankle Exercises: Stretches   Soleus Stretch  30 seconds;3 reps bilat    Gastroc Stretch  30 seconds;3 reps  bilat    Other Stretch  hamstring stretch 30 sec x 2 reps, pt seated.  bilat      Ankle Exercises: Aerobic   Elliptical  trial; increased Lt ankle pain- stopped     Tread Mill  5.5 min @ 3.45mh      Ankle Exercises: Seated   BAPS  Sitting;Level 4 PF/DF, inv/eversion, CW/CCW    Other Seated Ankle Exercises  Lt PF with knee bent and blue band x 10, black band x 10;  bilat ankle eversion with blue band x 10 reps, 2 sets       Ankle Exercises: Supine   T-Band  reviewed verbally, with pt demonstrating technique.  PT Long Term Goals - 08/23/17 1256      PT LONG TERM GOAL #1   Title  5/5 strength Lt hip abductors and Lt ankle 09/28/17    Time  6    Period  Weeks    Status  On-going      PT LONG TERM GOAL #2   Title  Improve balance and proprioception with Rob to demonstrate SLS bilat for 20-30 sec without UE support 09/28/17    Time  6    Period  Weeks    Status  On-going      PT LONG TERM GOAL #3   Title  Independent in HEP 09/28/17    Time  6    Period  Weeks    Status  On-going      PT LONG TERM GOAL #4   Title  Rob reports return to sports with minimal to no limitations or pain 09/28/17    Time  6    Period  Weeks    Status  On-going      PT LONG TERM GOAL #5   Title  Improve FOTO to </= 21% limitation 09/28/17    Time  6    Period  Weeks    Status  On-going            Plan - 08/23/17 1251    Clinical Impression Statement  Pt unable to tolerate elliptical today; switched to treadmill without difficulty.  He tolerated all other exercises well, without pain just fatigue.  He is point tender over  Lt anterior lateral malleoli; will plan to use tape or ionto over this in future visit.  No goals met yet; only 2nd visit.     Rehab Potential  Good    PT Frequency  2x / week    PT Duration  6 weeks    PT Treatment/Interventions  Patient/family education;ADLs/Self Care Home Management;Cryotherapy;Electrical Stimulation;Iontophoresis 54m/ml Dexamethasone;Moist Heat;Ultrasound;Dry needling;Manual techniques;Neuromuscular re-education;Therapeutic activities;Therapeutic exercise;Balance training    PT Next Visit Plan  add Lt hip abdct strengthening and proprioception exercises to HEP.  assess response to IASTM. Possibly use Rock tape.     Consulted and Agree with Plan of Care  Patient       Patient will benefit from skilled therapeutic intervention in order to improve the following deficits and impairments:  Decreased mobility, Decreased range of motion, Decreased strength, Decreased activity tolerance  Visit Diagnosis: Pain in left ankle and joints of left foot  Muscle weakness (generalized)  Other symptoms and signs involving the musculoskeletal system     Problem List Patient Active Problem List   Diagnosis Date Noted  . Sprain of ankle, left 08/09/2017  . Physical growth delay   . Hypothyroidism, acquired, autoimmune   . Multiple respiratory allergies 11/21/2010  . Environmental allergies 11/21/2010  . Migraine headache 11/21/2010  . Health maintenance examination 11/21/2010   JKerin [name] PTA 08/23/17 12:56 PM  [place] [address]Phone: [phone]  Fax:  [phone] Name: Paul CalabrettaMRN: [id number] of Birth: 103-19-1997

## 2017-08-25 ENCOUNTER — Ambulatory Visit (INDEPENDENT_AMBULATORY_CARE_PROVIDER_SITE_OTHER): Payer: BLUE CROSS/BLUE SHIELD | Admitting: Physical Therapy

## 2017-08-25 DIAGNOSIS — M6281 Muscle weakness (generalized): Secondary | ICD-10-CM

## 2017-08-25 DIAGNOSIS — M25572 Pain in left ankle and joints of left foot: Secondary | ICD-10-CM | POA: Diagnosis not present

## 2017-08-25 NOTE — Therapy (Signed)
[facility] [address] [place], [place], [address] Phone: [phone]   Fax:  [phone]  Physical Therapy Treatment  Patient Details  Name: Paul [name] MRN: [id number] Date of Birth: 09/14/1995 Referring Provider: Dr. Benjamin Stainhekkekandam   Encounter Date: 08/25/2017  PT End of Session - 08/25/17 1213    Visit Number  3    Number of Visits  12    Date for PT Re-Evaluation  09/28/17    PT Start Time  0930    PT Stop Time  1015    PT Time Calculation (min)  45 min    Activity Tolerance  Patient tolerated treatment well;No increased pain    Behavior During Therapy  WFL for tasks assessed/performed       Past Medical History:  Diagnosis Date  . Allergy   . Goiter   . Headache(784.0)   . Hypothyroid   . Hypothyroidism, acquired, autoimmune   . Migraine   . Physical growth delay   . Puberty delay   . Thyroiditis, autoimmune     Past Surgical History:  Procedure Laterality Date  . ADENOIDECTOMY    . NASAL TURBINATE REDUCTION    . TONSILECTOMY, ADENOIDECTOMY, BILATERAL MYRINGOTOMY AND TUBES      There were no vitals filed for this visit.  Subjective Assessment - 08/25/17 1122    Subjective  Pt reports overall his ankle is improving but he still has pain with sports or running.    Currently in Pain?  No/denies                       [facility] Treatment/Exercise - 08/25/17 0001      Modalities   Modalities  Ultrasound      Ultrasound   Ultrasound Location  Lt ankle    Ultrasound Parameters  .8 w/cm, 100%, 3.823mhz, 8 min    Ultrasound Goals  Pain      Ankle Exercises: Stretches   Soleus Stretch  30 seconds;3 reps    Gastroc Stretch  30 seconds;3 reps    Other Stretch  hamstring stretch 30 sec x 2 reps, pt seated.       Ankle Exercises: Aerobic   Tread Mill  5.5 min @ 3.465mph      Ankle Exercises: Seated   BAPS  Sitting;Level 4    Other Seated Ankle Exercises  T-band 4 way blue  2X15      Ankle Exercises: Standing   SLS  30 sec x 3 reps Lt on foam    Heel Raises  20 reps    Toe Raise  20 reps    Other Standing Ankle Exercises  step downs and lateral step ups 6 inch step no HHA, 20 reps ea             PT Education - 08/25/17 1213    Education Details  U.S Edu, technique for new exercises    Person(s) Educated  Patient    Methods  Explanation;Demonstration;Verbal cues    Comprehension  Verbalized understanding;Returned demonstration                 Plan - 08/25/17 1214    Clinical Impression Statement  Pt able to progress strength and ankle stability today adding in foam surface for balance training and H/T raises with good tolerance. He was also able to progress reps with T-band 4 way. U.S was trialed today for pain and infammation that he still experiences  during running.    Rehab Potential  Good    PT Frequency  2x / week    PT Duration  6 weeks    PT Treatment/Interventions  Patient/family education;ADLs/Self Care Home Management;Cryotherapy;Electrical Stimulation;Iontophoresis 4mg /ml Dexamethasone;Moist Heat;Ultrasound;Dry needling;Manual techniques;Neuromuscular re-education;Therapeutic activities;Therapeutic exercise;Balance training    PT Next Visit Plan  progress proprioception, assess response to U.S, KT tape if needed    Consulted and Agree with Plan of Care  Patient       Patient will benefit from skilled therapeutic intervention in order to improve the following deficits and impairments:  Decreased mobility, Decreased range of motion, Decreased strength, Decreased activity tolerance  Visit Diagnosis: Pain in left ankle and joints of left foot  Muscle weakness (generalized)     Problem List Patient Active Problem List   Diagnosis Date Noted  . Sprain of ankle, left 08/09/2017  . Physical growth delay   . Hypothyroidism, acquired, autoimmune   . Multiple respiratory allergies 11/21/2010  . Environmental allergies  11/21/2010  . Migraine headache 11/21/2010  . Health maintenance examination 11/21/2010    April Manson, PT, DPT 08/25/2017, 12:25 PM  [facility] [address] [place], [place], [address] Phone: [phone]   Fax:  [phone]  Name: Paul [name] MRN: [id number] Date of Birth: 04/27/1995

## 2017-08-30 ENCOUNTER — Encounter: Payer: Self-pay | Admitting: Rehabilitative and Restorative Service Providers"

## 2017-08-30 ENCOUNTER — Ambulatory Visit: Payer: BLUE CROSS/BLUE SHIELD | Admitting: Rehabilitative and Restorative Service Providers"

## 2017-08-30 DIAGNOSIS — M6281 Muscle weakness (generalized): Secondary | ICD-10-CM | POA: Diagnosis not present

## 2017-08-30 DIAGNOSIS — M25572 Pain in left ankle and joints of left foot: Secondary | ICD-10-CM | POA: Diagnosis not present

## 2017-08-30 DIAGNOSIS — R29898 Other symptoms and signs involving the musculoskeletal system: Secondary | ICD-10-CM

## 2017-08-30 NOTE — Therapy (Signed)
[facility] [address] [place], [place], [address] Phone: [phone]   Fax:  [phone]  Physical Therapy Treatment  Patient Details  Name: Paul [name] MRN: [id number] Date of Birth: 12/23/1995 Referring Provider: Dr. Benjamin Stainhekkekandam   Encounter Date: 08/30/2017  PT End of Session - 08/30/17 1103    Visit Number  4    Number of Visits  12    Date for PT Re-Evaluation  09/28/17    PT Start Time  1015    PT Stop Time  1102    PT Time Calculation (min)  47 min    Activity Tolerance  Patient tolerated treatment well       Past Medical History:  Diagnosis Date  . Allergy   . Goiter   . Headache(784.0)   . Hypothyroid   . Hypothyroidism, acquired, autoimmune   . Migraine   . Physical growth delay   . Puberty delay   . Thyroiditis, autoimmune     Past Surgical History:  Procedure Laterality Date  . ADENOIDECTOMY    . NASAL TURBINATE REDUCTION    . TONSILECTOMY, ADENOIDECTOMY, BILATERAL MYRINGOTOMY AND TUBES      There were no vitals filed for this visit.  Subjective Assessment - 08/30/17 1021    Subjective  Patient reports that he feels his ankle is getting a little stronger. Working on his exercises at home     Currently in Pain?  No/denies                       [facility] Adult [facility] Treatment/Exercise - 08/30/17 0001      Ultrasound   Ultrasound Location  Lt ankle     Ultrasound Parameters  .8 w/cm2; 100%; 1 mHz; 5 min     Ultrasound Goals  Pain      Iontophoresis   Type of Iontophoresis  Dexamethasone    Location  lateral Lt ankle     Dose  120 mAmp    Time  10 hours       Ankle Exercises: Aerobic   Tread Mill  5 min @ 3.385mph      Ankle Exercises: Stretches   Restaurant manager, fast foodlant Board Stretch Limitations  30 sec x 3;     Other Stretch  prostretch 30 sec x 3       Ankle Exercises: Standing   Vector Stance  Right;Left   SLS tapping toe fwd/side/back x 10 x 2 sets   Vector Stance  Limitations  reaching fowrd to touch chair 10 resp x 2 sets     SLS  30 sec x 3 reps Lt on foam    Toe Raise  20 reps   foam   Balance Beam  balance on half foam 30 sec x 3; stepping fwd/back x 10       Ankle Exercises: Sidelying   Ankle Eversion  --   eversion with hip abduction x 10    Other Sidelying Ankle Exercises  Hip abduction leading up with heel X10 x 2 sets Lt and one on Rt              PT Education - 08/30/17 1042    Education Details  HEP ionto    Person(s) Educated  Patient    Methods  Explanation;Demonstration;Tactile cues;Verbal cues;Handout    Comprehension  Verbalized understanding;Returned demonstration;Verbal cues required;Tactile cues required          PT Long Term Goals - 08/30/17 1023  PT LONG TERM GOAL #1   Title  5/5 strength Lt hip abductors and Lt ankle 09/28/17    Time  6    Status  On-going      PT LONG TERM GOAL #2   Title  Improve balance and proprioception with Rob to demonstrate SLS bilat for 20-30 sec without UE support 09/28/17    Time  6    Period  Weeks    Status  On-going      PT LONG TERM GOAL #3   Title  Independent in HEP 09/28/17    Time  6    Period  Weeks    Status  On-going      PT LONG TERM GOAL #4   Title  Rob reports return to sports with minimal to no limitations or pain 09/28/17    Time  6    Period  Weeks    Status  On-going      PT LONG TERM GOAL #5   Title  Improve FOTO to </= 21% limitation 09/28/17    Time  6    Period  Weeks    Status  On-going            Plan - 08/30/17 1042    Clinical Impression Statement  Continued progress with balance and strengthening activities. Patient is progressing well toward goals of therapy. Patient will benefit from continued strengthening Lt hip abductions. Progressing well with balance activities.     Rehab Potential  Good    PT Frequency  2x / week    PT Duration  6 weeks    PT Treatment/Interventions  Patient/family education;ADLs/Self Care Home  Management;Cryotherapy;Electrical Stimulation;Iontophoresis 4mg /ml Dexamethasone;Moist Heat;Ultrasound;Dry needling;Manual techniques;Neuromuscular re-education;Therapeutic activities;Therapeutic exercise;Balance training    PT Next Visit Plan  progress proprioception, assess response to U.S, KT tape if needed - assess response to ionto     Consulted and Agree with Plan of Care  Patient       Patient will benefit from skilled therapeutic intervention in order to improve the following deficits and impairments:  Decreased mobility, Decreased range of motion, Decreased strength, Decreased activity tolerance  Visit Diagnosis: Pain in left ankle and joints of left foot  Muscle weakness (generalized)  Other symptoms and signs involving the musculoskeletal system     Problem List Patient Active Problem List   Diagnosis Date Noted  . Sprain of ankle, left 08/09/2017  . Physical growth delay   . Hypothyroidism, acquired, autoimmune   . Multiple respiratory allergies 11/21/2010  . Environmental allergies 11/21/2010  . Migraine headache 11/21/2010  . Health maintenance examination 11/21/2010    Saharah Sherrow Rober MinionP Nickia Boesen PT, MPH  08/30/2017, 11:19 AM  [facility] [address] [place], [place], [address] Phone: [phone]   Fax:  [phone]  Name: Paul DameRobert Allen [name] MRN: [id number] Date of Birth: 09/07/1995

## 2017-08-30 NOTE — Patient Instructions (Addendum)
Try balance activities with eyes closed   Balance: Unilateral - Forward Lean    Stand on left foot, hands on hips. Keeping hips level, bend forward as if to touch forehead to wall. Hold __2-3__ seconds. Relax. Repeat __10__ times per set. Do _2-3___ sets per session. Do __1__ sessions per day.  FUNCTIONAL MOBILITY: Lateral Step Up    Step up sideways, leading with right leg. Just tap the right heel onto the floor - do not shift weight  _10__ reps per set, _2-3__ sets per day   Balance on left leg - tap right foot forward/side/back  Repeat 10 times 2-3 sets   IONTOPHORESIS PATIENT PRECAUTIONS & CONTRAINDICATIONS:  . Redness under one or both electrodes can occur.  This characterized by a uniform redness that usually disappears within 12 hours of treatment. . Small pinhead size blisters may result in response to the drug.  Contact your physician if the problem persists more than 24 hours. . On rare occasions, iontophoresis therapy can result in temporary skin reactions such as rash, inflammation, irritation or burns.  The skin reactions may be the result of individual sensitivity to the ionic solution used, the condition of the skin at the start of treatment, reaction to the materials in the electrodes, allergies or sensitivity to dexamethasone, or a poor connection between the patch and your skin.  Discontinue using iontophoresis if you have any of these reactions and report to your therapist. . Remove the Patch or electrodes if you have any undue sensation of pain or burning during the treatment and report discomfort to your therapist. . Tell your Therapist if you have had known adverse reactions to the application of electrical current. . If using the Patch, the LED light will turn off when treatment is complete and the patch can be removed.  Approximate treatment time is 1-3 hours.  Remove the patch when light goes off or after 6 hours. . The Patch can be worn during normal activity,  however excessive motion where the electrodes have been placed can cause poor contact between the skin and the electrode or uneven electrical current resulting in greater risk of skin irritation. Marland Kitchen. Keep out of the reach of children.   . DO NOT use if you have a cardiac pacemaker or any other electrically sensitive implanted device. . DO NOT use if you have a known sensitivity to dexamethasone. . DO NOT use during Magnetic Resonance Imaging (MRI). . DO NOT use over broken or compromised skin (e.g. sunburn, cuts, or acne) due to the increased risk of skin reaction. . DO NOT SHAVE over the area to be treated:  To establish good contact between the Patch and the skin, excessive hair may be clipped. . DO NOT place the Patch or electrodes on or over your eyes, directly over your heart, or brain. . DO NOT reuse the Patch or electrodes as this may cause burns to occur.

## 2017-09-06 ENCOUNTER — Encounter: Payer: Self-pay | Admitting: Rehabilitative and Restorative Service Providers"

## 2017-09-06 ENCOUNTER — Ambulatory Visit: Payer: BLUE CROSS/BLUE SHIELD | Admitting: Rehabilitative and Restorative Service Providers"

## 2017-09-06 DIAGNOSIS — M25572 Pain in left ankle and joints of left foot: Secondary | ICD-10-CM

## 2017-09-06 DIAGNOSIS — M6281 Muscle weakness (generalized): Secondary | ICD-10-CM | POA: Diagnosis not present

## 2017-09-06 DIAGNOSIS — R29898 Other symptoms and signs involving the musculoskeletal system: Secondary | ICD-10-CM

## 2017-09-06 NOTE — Therapy (Addendum)
[place] [address] [place], [place], [address] Phone: [phone]   Fax:  [phone]  Physical Therapy Treatment  Patient Details  Name: Paul [name] MRN: [id number] Date of Birth: 09/06/1995 Referring Provider: Dr Dianah Field    Encounter Date: 09/06/2017  PT End of Session - 09/06/17 1108    Visit Number  5    Number of Visits  12    Date for PT Re-Evaluation  09/28/17    PT Start Time  1102    PT Stop Time  1149    PT Time Calculation (min)  47 min    Activity Tolerance  Patient tolerated treatment well       Past Medical History:  Diagnosis Date  . Allergy   . Goiter   . Headache(784.0)   . Hypothyroid   . Hypothyroidism, acquired, autoimmune   . Migraine   . Physical growth delay   . Puberty delay   . Thyroiditis, autoimmune     Past Surgical History:  Procedure Laterality Date  . ADENOIDECTOMY    . NASAL TURBINATE REDUCTION    . TONSILECTOMY, ADENOIDECTOMY, BILATERAL MYRINGOTOMY AND TUBES      There were no vitals filed for this visit.  Subjective Assessment - 09/06/17 1109    Subjective  Still some "twinge" in the ankle with jogging. Feels he has made some progress with strengthening during therapy. Confident with continuing with his exercises independently at home.    Currently in Pain?  No/denies         [facility] PT Assessment - 09/06/17 0001      Assessment   Medical Diagnosis  Recurrent Lt ankle sprains    Referring Provider  Dr Dianah Field     Onset Date/Surgical Date  05/02/17   initial sprain ~ 4 yrs ago    Hand Dominance  Right    Next MD Visit  1 month after PT     Prior Therapy  none       Observation/Other Assessments   Focus on Therapeutic Outcomes (FOTO)   34% limitation       AROM   Right Ankle Dorsiflexion  15    Right Ankle Plantar Flexion  54    Right Ankle Inversion  25    Right Ankle Eversion  13    Left Ankle Dorsiflexion  15    Left Ankle Plantar  Flexion  54    Left Ankle Inversion  23    Left Ankle Eversion  18      Strength   Left Hip ABduction  5/5    Right Ankle Dorsiflexion  5/5    Right Ankle Plantar Flexion  5/5    Right Ankle Inversion  5/5    Right Ankle Eversion  5/5    Left Ankle Dorsiflexion  5/5    Left Ankle Plantar Flexion  --   5-/5   Left Ankle Inversion  5/5    Left Ankle Eversion  5/5      Flexibility   Hamstrings  Rt tight 67 deg; Lt 65 deg     Quadriceps  WNL    ITB  WNL    Piriformis  WNL       Palpation   Palpation comment  tender Lt lateral ankle - anterior talofibular ligament                    [facility] Adult PT Treatment/Exercise - 09/06/17 0001  Ankle Exercises: Aerobic   Tread Mill  6 min @ 4.2 mph walking/jogging      Ankle Exercises: Stretches   Soleus Stretch  30 seconds;3 reps    Gastroc Stretch  30 seconds;3 reps    Other Stretch  hamstring stretch 30 sec x 2 reps, pt seated.       Ankle Exercises: Standing   Vector Stance  Right;Left   SLS tapping toe fwd/side/back x 10 x 2 sets   Vector Stance Limitations  reaching fowrd to touch chair 10 resp x 2 sets     SLS  30 sec x 3 reps Lt on foam    Heel Raises  20 reps;Left    Balance Beam  balance on half foam 30 sec x 3; stepping fwd/back x 10       Ankle Exercises: Sidelying   Other Sidelying Ankle Exercises  Hip abduction leading up with heel X10 x 2 sets; hip abduction tapping fwd/back x 3 each x 10 reps; clam blue TB x 10       Ankle Exercises: Supine   Other Supine Ankle Exercises  hip abduction - clam moving one LE holding opposite still blue TB x 10       Ankle Exercises: Seated   Other Seated Ankle Exercises  T-band 4 way blue 2X15             PT Education - 09/06/17 1143    Education Details  HEP     Person(s) Educated  Patient    Methods  Explanation;Demonstration;Tactile cues;Verbal cues;Handout    Comprehension  Verbalized understanding;Returned demonstration;Verbal cues required;Tactile  cues required          PT Long Term Goals - 09/06/17 1120      PT LONG TERM GOAL #1   Title  5/5 strength Lt hip abductors and Lt ankle 09/28/17    Time  6    Period  Weeks    Status  Achieved      PT LONG TERM GOAL #2   Title  Improve balance and proprioception with Paul [name] to demonstrate SLS bilat for 20-30 sec without UE support 09/28/17    Time  6    Period  Weeks    Status  Achieved      PT LONG TERM GOAL #3   Title  Independent in HEP 09/28/17    Time  6    Period  Weeks    Status  Achieved      PT LONG TERM GOAL #4   Title  Paul [name] reports return to sports with minimal to no limitations or pain 09/28/17    Time  6    Period  Weeks    Status  Partially Met      PT LONG TERM GOAL #5   Title  Improve FOTO to </= 21% limitation 09/28/17    Time  6    Period  Weeks    Status  Partially Met            Plan - 09/06/17 1117    Clinical Impression Statement  Progressing well with a variety of strengthening activities for Lt LE/ankle. Good gains in ROM/tissue extensibility and strength. Paul [name] has some continued tenderness to palpatioin in lateral ankle and mild weakness 5-/5 in Lt ankle plantar flexion. Remainder of ankle strength and Lt hip abduction strength is now 5/5. Goals of therapy have been partially accomplished. Paul [name] requests d/c and states that he is confident in continuing with his  home program - knows it will take additional time to accomplishe return to full activity level - with history of at least 3 ankle sprains dating back for 4 years. He is independent in HEP     Rehab Potential  Good    PT Frequency  2x / week    PT Duration  6 weeks    PT Treatment/Interventions  Patient/family education;ADLs/Self Care Home Management;Cryotherapy;Electrical Stimulation;Iontophoresis 38m/ml Dexamethasone;Moist Heat;Ultrasound;Dry needling;Manual techniques;Neuromuscular re-education;Therapeutic activities;Therapeutic exercise;Balance training    PT Next Visit Plan  D/C to  independent HEP Patient will call with any concerns or questions.     Consulted and Agree with Plan of Care  Patient       Patient will benefit from skilled therapeutic intervention in order to improve the following deficits and impairments:  Decreased mobility, Decreased range of motion, Decreased strength, Decreased activity tolerance  Visit Diagnosis: Pain in left ankle and joints of left foot  Muscle weakness (generalized)  Other symptoms and signs involving the musculoskeletal system     Problem List Patient Active Problem List   Diagnosis Date Noted  . Sprain of ankle, left 08/09/2017  . Physical growth delay   . Hypothyroidism, acquired, autoimmune   . Multiple respiratory allergies 11/21/2010  . Environmental allergies 11/21/2010  . Migraine headache 11/21/2010  . Health maintenance examination 11/21/2010    Celyn PNilda SimmerPT, MPH  09/06/2017, 1:24 PM  [facility] [address]Phone: [phone]  Fax:  [phone] Name: RKhylon DaviesMRN: [id number] of Birth: 1February 14, 1997 PHYSICAL THERAPY DISCHARGE SUMMARY  Visits from Start of Care: 5  Current functional level related to goals / functional outcomes: See last progress note for discharge status    Remaining deficits: Should continue strengthening to further resolve functional weakness and discomfort    Education / Equipment: HEP  Plan: Patient agrees to discharge.  Patient goals were met. Patient is being discharged due to meeting the stated rehab goals.  ?????    Celyn P. HHelene KelpPT, MPH 09/29/17 4:35 PM

## 2017-09-06 NOTE — Patient Instructions (Addendum)
Strengthening: Hip Abduction (Side-Lying) lead with heel     Tighten muscles on left hip then lift leg ____ inches from surface, keeping knee locked.  Repeat ____ times per set. Do ____ sets per session. Do ____ sessions per day.  Repeat with tap back and tap forward - rainbow    Strengthening: Hip Abductor - Resisted    With band looped around both legs above knees, bring one knee out while holding opposite knee still  Repeat ____ times per set. Do ____ sets per session. Do ____ sessions per day.     Abduction: Clam (Eccentric) - Side-Lying    Lie on side with knees bent. Lift top knee, keeping feet together. Keep trunk steady. Slowly lower for 3-5 seconds. ___ reps per set, ___ sets per day, ___ days per week. With blue band     Strengthening: Hip Abduction - Resisted theraband above knees     With tubing around right leg above knees, other side toward anchor, extend leg out from side leading with heel. Repeat ____ times per set. Do ____ sets per session. Do ____ sessions per day.  Side steps toward both sides with band above knees

## 2017-11-09 ENCOUNTER — Other Ambulatory Visit: Payer: Self-pay | Admitting: Sports Medicine

## 2018-02-14 ENCOUNTER — Other Ambulatory Visit: Payer: Self-pay | Admitting: Sports Medicine

## 2019-04-22 IMAGING — DX DG ANKLE COMPLETE 3+V*L*
3 series · 3 of 3 positions shown · non-contrast
Comparison: None.

CLINICAL DATA: Recurrent ankle sprain

EXAM:
LEFT ANKLE COMPLETE - 3+ VIEW

[ankle ap]
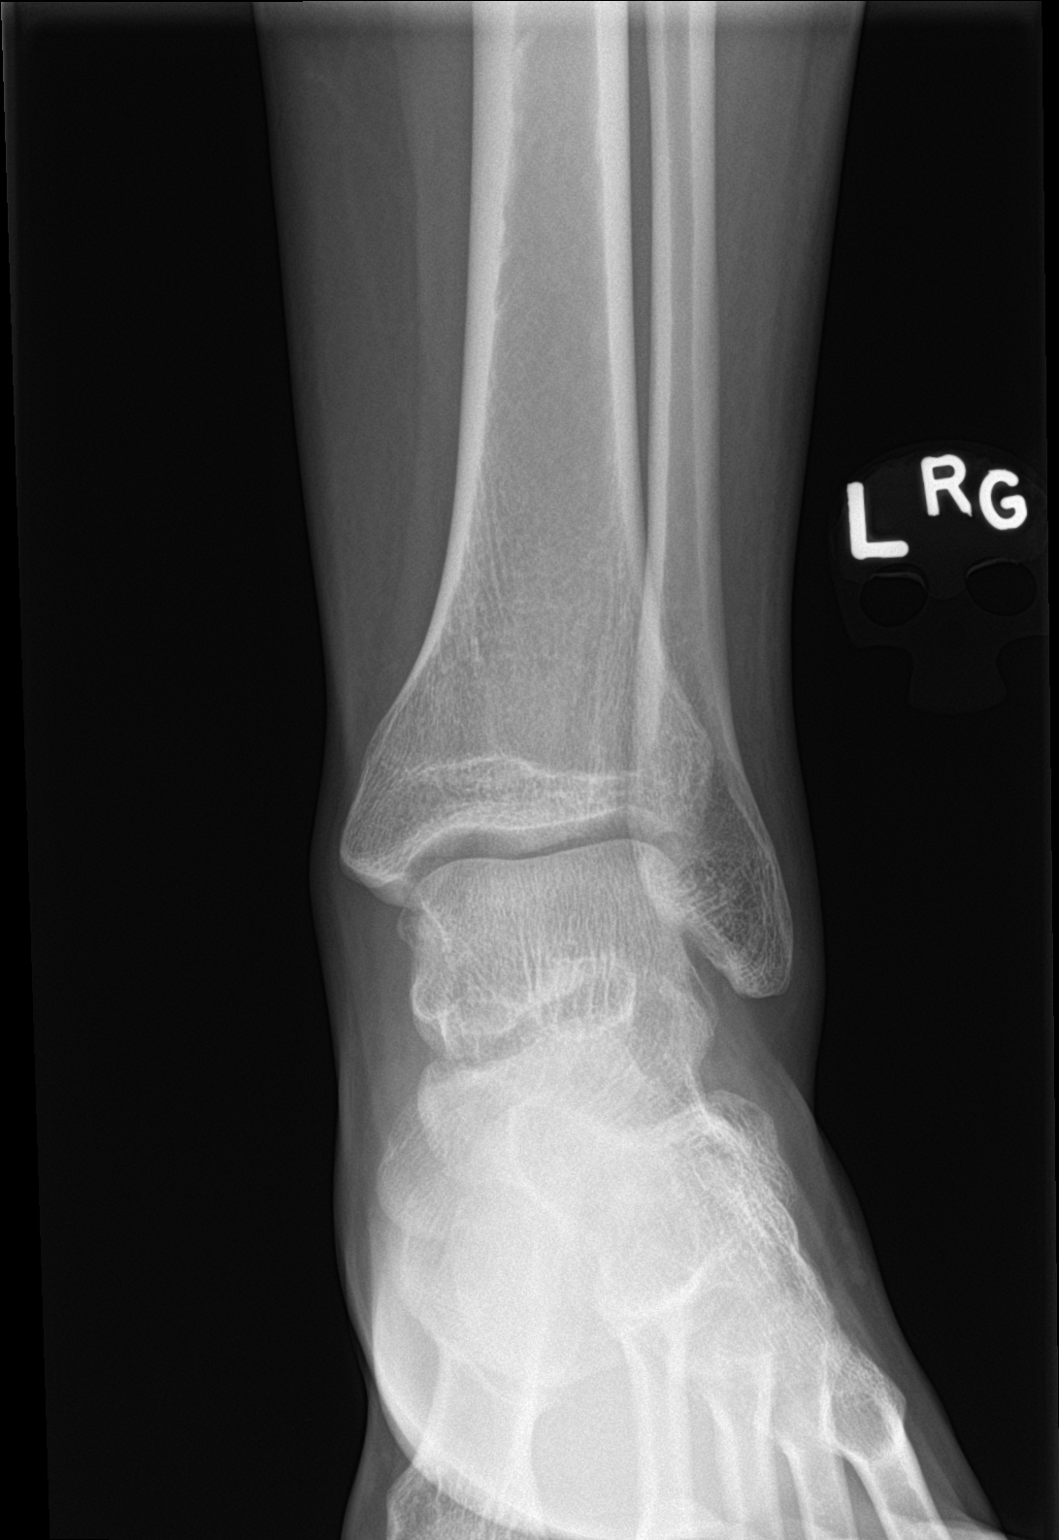

[ankle obl]
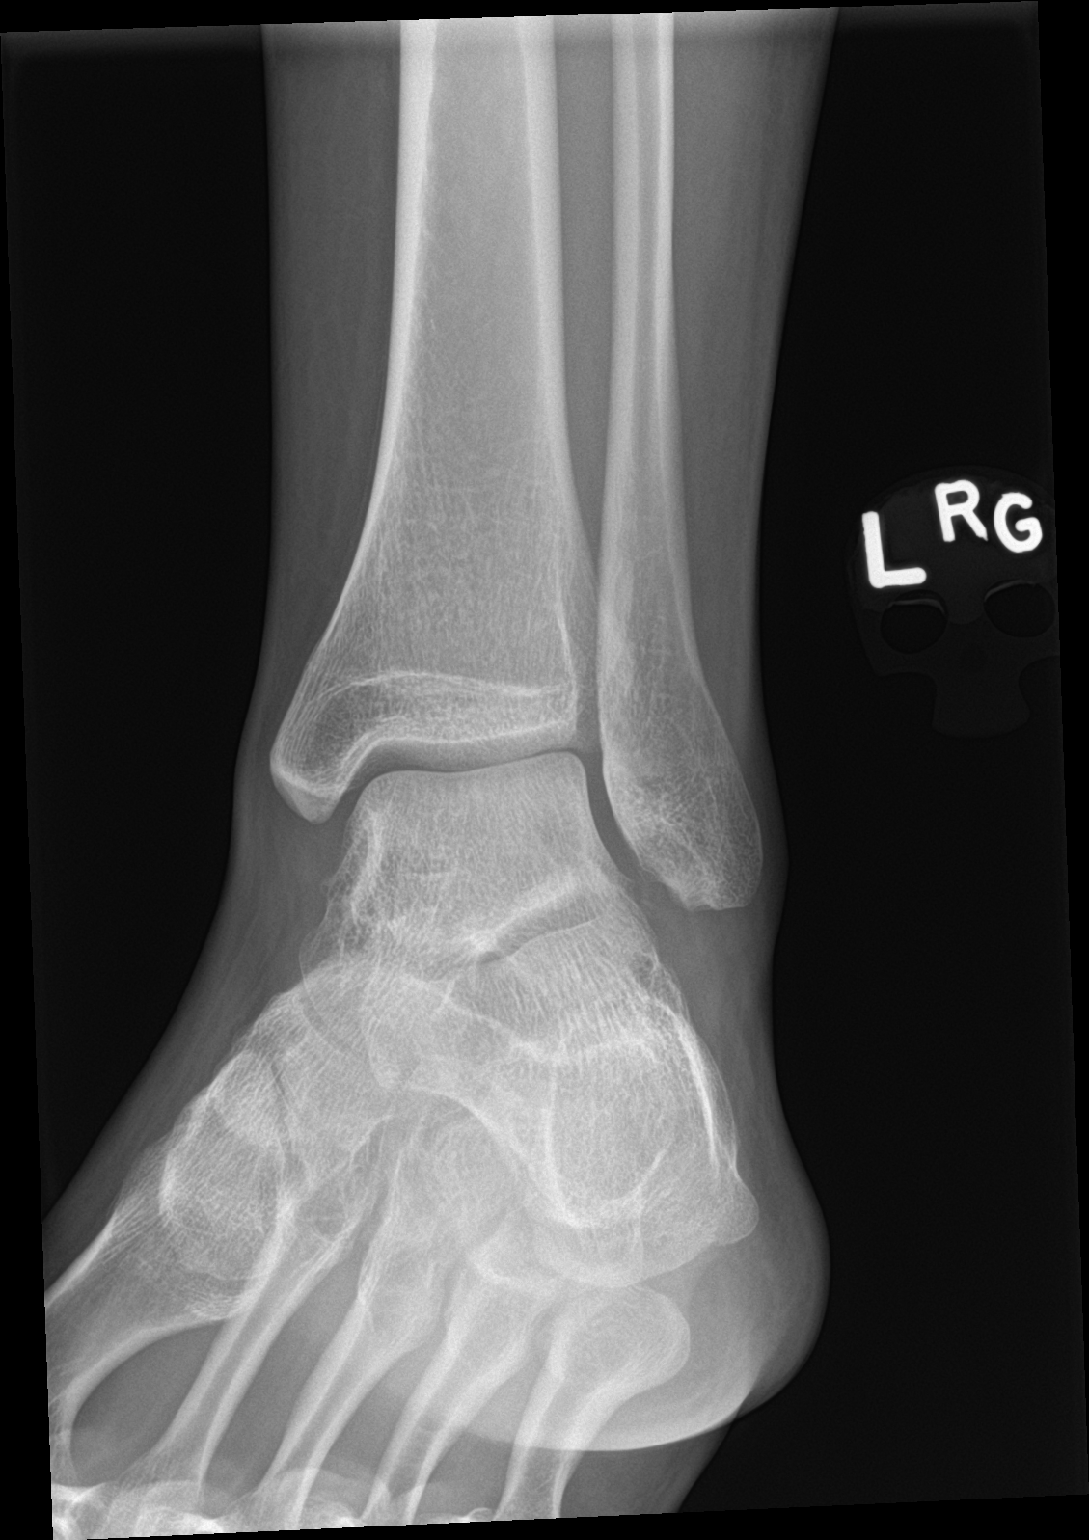

[ankle lat]
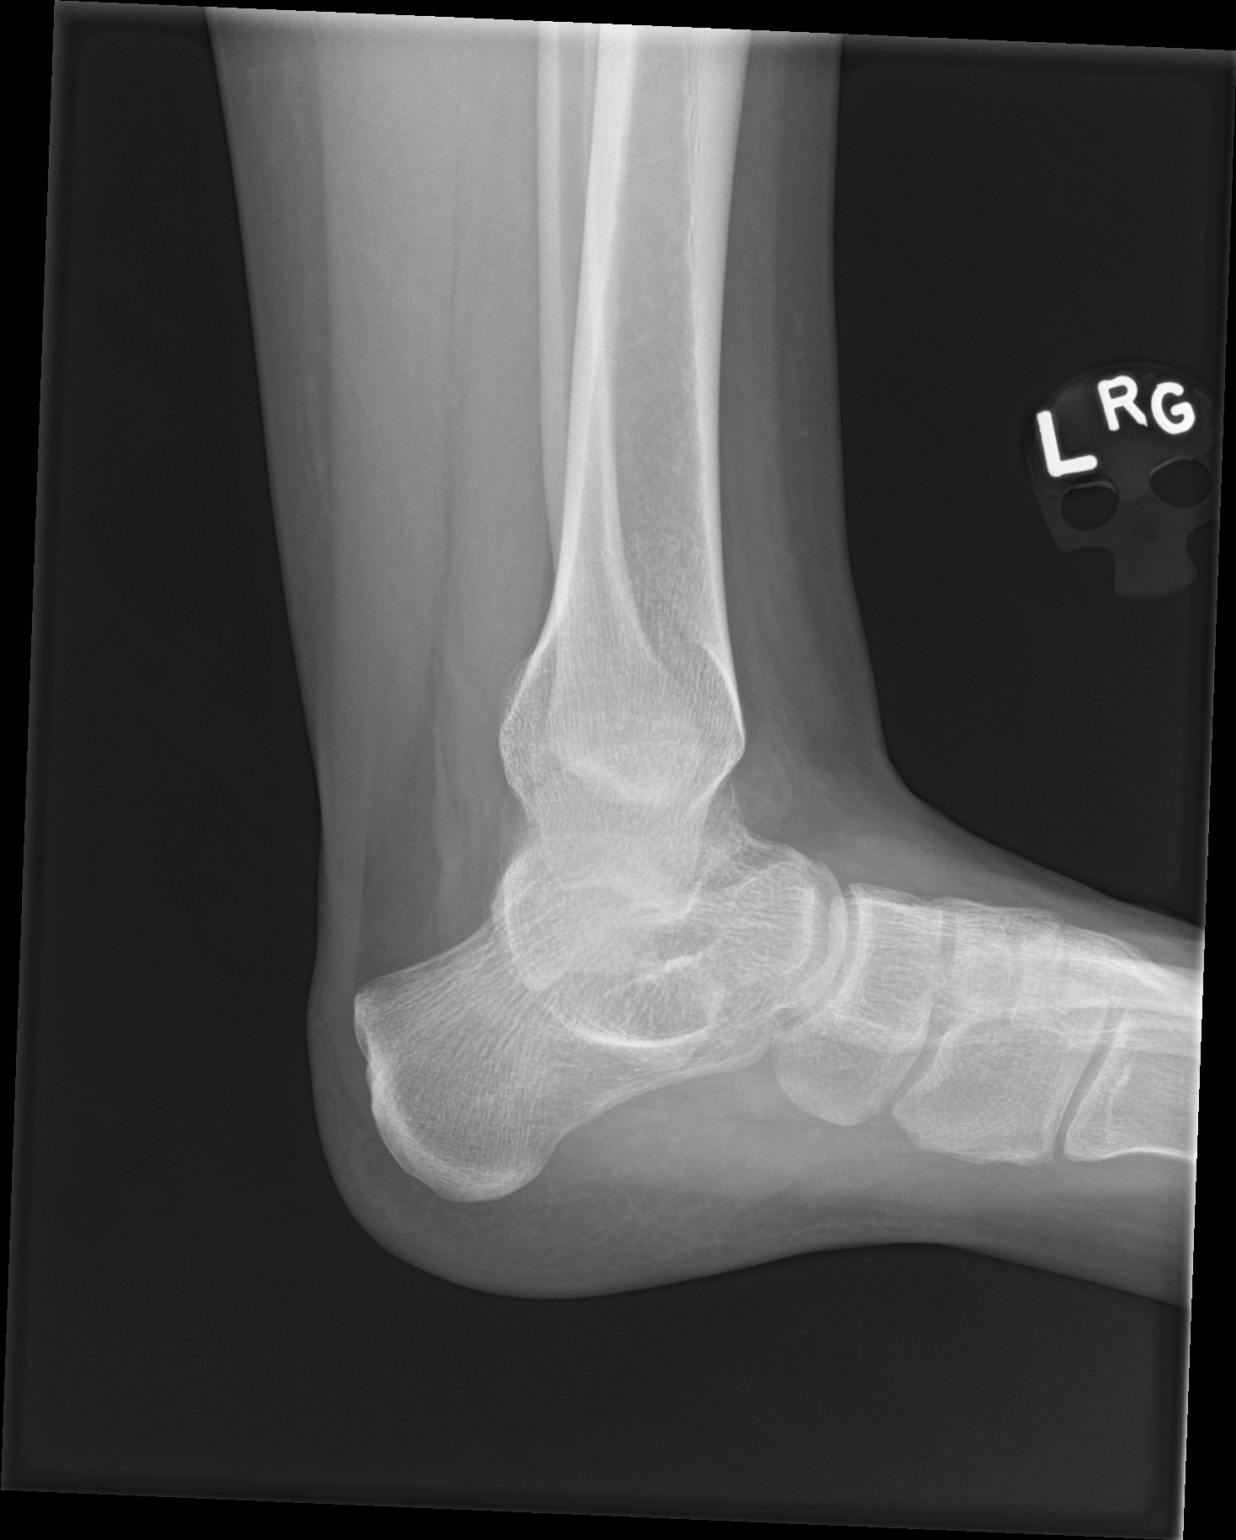

[3 of 3 positions shown; findings below may reference images not displayed]

FINDINGS: Ankle mortise is symmetric. Possible tiny avulsion between the
lateral lower talus and the fibular malleolus.
IMPRESSION: Possible age indeterminate avulsion injury between the lateral lower
talus and the fibular malleolus.

## 2019-04-27 ENCOUNTER — Other Ambulatory Visit: Payer: Self-pay | Admitting: Sports Medicine

## 2019-05-03 ENCOUNTER — Other Ambulatory Visit: Payer: Self-pay

## 2019-05-03 ENCOUNTER — Ambulatory Visit (INDEPENDENT_AMBULATORY_CARE_PROVIDER_SITE_OTHER): Payer: BC Managed Care – PPO | Admitting: Sports Medicine

## 2019-05-03 ENCOUNTER — Encounter: Payer: Self-pay | Admitting: Sports Medicine

## 2019-05-03 DIAGNOSIS — Z0000 Encounter for general adult medical examination without abnormal findings: Secondary | ICD-10-CM | POA: Diagnosis not present

## 2019-05-03 DIAGNOSIS — H6123 Impacted cerumen, bilateral: Secondary | ICD-10-CM

## 2019-05-03 DIAGNOSIS — E063 Autoimmune thyroiditis: Secondary | ICD-10-CM | POA: Diagnosis not present

## 2019-05-03 DIAGNOSIS — G43009 Migraine without aura, not intractable, without status migrainosus: Secondary | ICD-10-CM | POA: Diagnosis not present

## 2019-05-03 MED ORDER — RIZATRIPTAN BENZOATE 5 MG PO TBDP: 5.0000 mg | ORAL_TABLET | ORAL | 3 refills | Status: DC | PRN

## 2019-05-03 MED ORDER — SYNTHROID 25 MCG PO TABS: 25.0000 ug | ORAL_TABLET | Freq: Every day | ORAL | 3 refills | Status: DC

## 2019-05-03 NOTE — Assessment & Plan Note (Signed)
Paul [name] has not taken his levothyroxine in about a week, he feels somewhat tired, refilling medication, he will return in about a month to recheck his labs, if we check his TSH now it will certainly appear elevated.

## 2019-05-03 NOTE — Assessment & Plan Note (Signed)
Today we did an annual physical for Paul [name], he is doing extremely well, he is finished college and he is working for [employer]ation [employer]. He is happy, living in [place].

## 2019-05-03 NOTE — Progress Notes (Signed)
Subjective:    CC: Annual Physical Exam  HPI:  This patient is here for their annual physical  I reviewed the past medical history, family history, social history, surgical history, and allergies today and no changes were needed.  Please see the problem list section below in epic for further details.  Past Medical History: Past Medical History:  Diagnosis Date  . Allergy   . Goiter   . Headache(784.0)   . Hypothyroid   . Hypothyroidism, acquired, autoimmune   . Migraine   . Physical growth delay   . Puberty delay   . Thyroiditis, autoimmune    Past Surgical History: Past Surgical History:  Procedure Laterality Date  . ADENOIDECTOMY    . NASAL TURBINATE REDUCTION    . TONSILECTOMY, ADENOIDECTOMY, BILATERAL MYRINGOTOMY AND TUBES     Social History: Social History   Socioeconomic History  . Marital status: Single    Spouse name: Not on file  . Number of children: Not on file  . Years of education: Not on file  . Highest education level: Not on file  Occupational History  . Not on file  Tobacco Use  . Smoking status: Never Smoker  . Smokeless tobacco: Never Used  Substance and Sexual Activity  . Alcohol use: No  . Drug use: Not on file  . Sexual activity: Never  Other Topics Concern  . Not on file  Social History Narrative  . Not on file   Social Determinants of Health   Financial Resource Strain:   . Difficulty of Paying Living Expenses:   Food Insecurity:   . Worried About Programme researcher, broadcasting/film/video in the Last Year:   . Barista in the Last Year:   Transportation Needs:   . Freight forwarder (Medical):   Marland Kitchen Lack of Transportation (Non-Medical):   Physical Activity:   . Days of Exercise per Week:   . Minutes of Exercise per Session:   Stress:   . Feeling of Stress :   Social Connections:   . Frequency of Communication with Friends and Family:   . Frequency of Social Gatherings with Friends and Family:   . Attends Religious Services:   .  Active Member of Clubs or Organizations:   . Attends Banker Meetings:   Marland Kitchen Marital Status:    Family History: Family History  Problem Relation Age of Onset  . Hypothyroidism Mother   . Migraines Mother   . Allergies Mother   . Cancer Mother   . Cancer Father   . Allergies Father   . Hypothyroidism Sister   . Migraines Sister   . Allergies Sister   . Thyroid disease Sister   . Thyroid disease Maternal Aunt   . Cancer Maternal Grandmother   . Thyroid disease Maternal Grandfather    Allergies: Allergies  Allergen Reactions  . Cyproheptadine    Medications: See med rec.  Review of Systems: No headache, visual changes, nausea, vomiting, diarrhea, constipation, dizziness, abdominal pain, skin rash, fevers, chills, night sweats, swollen lymph nodes, weight loss, chest pain, body aches, joint swelling, muscle aches, shortness of breath, mood changes, visual or auditory hallucinations.  Objective:    General: Well Developed, well nourished, and in no acute distress.  Neuro: Alert and oriented x3, extra-ocular muscles intact, sensation grossly intact. Cranial nerves II through XII are intact, motor, sensory, and coordinative functions are all intact. HEENT: Normocephalic, atraumatic, pupils equal round reactive to light, neck supple, no masses, no lymphadenopathy, thyroid  nonpalpable. Oropharynx, nasopharynx, are unremarkable.  Bilateral cerumen impactions. Skin: Warm and dry, no rashes noted.  Cardiac: Regular rate and rhythm, no murmurs rubs or gallops.  Respiratory: Clear to auscultation bilaterally. Not using accessory muscles, speaking in full sentences.  Abdominal: Soft, nontender, nondistended, positive bowel sounds, no masses, no organomegaly.  Musculoskeletal: Shoulder, elbow, wrist, hip, knee, ankle stable, and with full range of motion.  Indication: Cerumen impaction of the left and right ear(s) Medical necessity statement: On physical examination, cerumen  impairs clinically significant portions of the external auditory canal, and tympanic membrane. Noted obstructive, copious cerumen that cannot be removed without magnification and instrumentations requiring physician skills Consent: Discussed benefits and risks of procedure and verbal consent obtained Procedure: Patient was prepped for the procedure. Utilized an otoscope to assess and take note of the ear canal, the tympanic membrane, and the presence, amount, and placement of the cerumen. Gentle water irrigation was utilized to remove cerumen.  Post procedure examination: shows cerumen was completely removed. Patient tolerated procedure well. The patient is made aware that they may experience temporary vertigo, temporary hearing loss, and temporary discomfort. If these symptom last for more than 24 hours to call the clinic or proceed to the ED.  Impression and Recommendations:    The patient was counselled, risk factors were discussed, anticipatory guidance given.  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 He is happy, living in [place].   Hypothyroidism, acquired, autoimmune Paul [name] has not taken his levothyroxine in about a week, he feels somewhat tired, refilling medication, he will return in about a month to recheck his labs, if we check his TSH now it will certainly appear elevated.  Migraine headache Paul [name] has started to have migraine headaches again, he describes them as a typical migraine, he does have bifrontal headaches with photophobia, neck pain, and feels as though he has to go to sleep, mild nausea but no vomiting. Historically he did well with Zomig. He did not respond well to Topamax and had some intolerance. I am going to start Maxalt 5 mg ODT. He will keep a migraine diary over the next month and if more than 4-6  headache days per month we will consider one of the preventive agent such as Emgality, Aimovig. I would like him to research these.   ___________________________________________ Gwen Her. Dianah Field, M.D., ABFM., CAQSM. Primary Care and Sports Medicine [facility]  Adjunct Professor of [place] of [facility] of Medicine

## 2019-05-03 NOTE — Patient Instructions (Signed)
Research Emgality and Aimovig which are migraine preventative agents.

## 2019-05-03 NOTE — Assessment & Plan Note (Signed)
Paul [name] has started to have migraine headaches again, he describes them as a typical migraine, he does have bifrontal headaches with photophobia, neck pain, and feels as though he has to go to sleep, mild nausea but no vomiting. Historically he did well with Zomig. He did not respond well to Topamax and had some intolerance. I am going to start Maxalt 5 mg ODT. He will keep a migraine diary over the next month and if more than 4-6 headache days per month we will consider one of the preventive agent such as Emgality, Aimovig. I would like him to research these.

## 2019-12-12 DIAGNOSIS — R0981 Nasal congestion: Secondary | ICD-10-CM | POA: Diagnosis not present

## 2019-12-12 DIAGNOSIS — Z20822 Contact with and (suspected) exposure to covid-19: Secondary | ICD-10-CM | POA: Diagnosis not present

## 2019-12-12 DIAGNOSIS — J029 Acute pharyngitis, unspecified: Secondary | ICD-10-CM | POA: Diagnosis not present

## 2020-04-26 ENCOUNTER — Other Ambulatory Visit: Payer: Self-pay | Admitting: Sports Medicine

## 2020-04-26 DIAGNOSIS — E063 Autoimmune thyroiditis: Secondary | ICD-10-CM

## 2021-04-28 ENCOUNTER — Other Ambulatory Visit: Payer: Self-pay | Admitting: Sports Medicine

## 2021-04-28 DIAGNOSIS — E063 Autoimmune thyroiditis: Secondary | ICD-10-CM

## 2021-05-11 ENCOUNTER — Other Ambulatory Visit: Payer: Self-pay

## 2021-05-11 DIAGNOSIS — E063 Autoimmune thyroiditis: Secondary | ICD-10-CM

## 2021-05-11 MED ORDER — SYNTHROID 25 MCG PO TABS: 25.0000 ug | ORAL_TABLET | Freq: Every day | ORAL | 0 refills | Status: DC

## 2021-05-11 NOTE — Progress Notes (Signed)
Patient scheduled for a fasting physical appt and 30 day supply of meds sent to pharmacy.  ?

## 2021-05-21 ENCOUNTER — Encounter: Payer: Self-pay | Admitting: Sports Medicine

## 2021-05-21 ENCOUNTER — Ambulatory Visit (INDEPENDENT_AMBULATORY_CARE_PROVIDER_SITE_OTHER): Payer: BC Managed Care – PPO | Admitting: Sports Medicine

## 2021-05-21 VITALS — BP 126/84 | HR 88 | Ht 67.0 in | Wt 150.0 lb

## 2021-05-21 DIAGNOSIS — Z23 Encounter for immunization: Secondary | ICD-10-CM

## 2021-05-21 DIAGNOSIS — E063 Autoimmune thyroiditis: Secondary | ICD-10-CM | POA: Diagnosis not present

## 2021-05-21 DIAGNOSIS — Z0000 Encounter for general adult medical examination without abnormal findings: Secondary | ICD-10-CM | POA: Diagnosis not present

## 2021-05-21 MED ORDER — SYNTHROID 25 MCG PO TABS: 25.0000 ug | ORAL_TABLET | Freq: Every day | ORAL | 3 refills | Status: DC

## 2021-05-21 NOTE — Addendum Note (Signed)
Addended by: Carolin Coy on: 05/21/2021 10:41 AM ? ? Modules accepted: Orders ? ?

## 2021-05-21 NOTE — Assessment & Plan Note (Addendum)
Annual physical as above. ?Healthy, happy male. ?Living in [place] right now. ?Due for Tdap, done today, routine labs. ?Return to see me in a year. ?

## 2021-05-21 NOTE — Assessment & Plan Note (Signed)
Historically well controlled, I will refill his levothyroxine once we see his TSH ?

## 2021-05-21 NOTE — Progress Notes (Signed)
?Subjective:   ? ?CC: Annual Physical Exam ? ?HPI:  ?This patient is here for their annual physical ? ?I reviewed the past medical history, family history, social history, surgical history, and allergies today and no changes were needed.  Please see the problem list section below in epic for further details. ? ?Past Medical History: ?Past Medical History:  ?Diagnosis Date  ? Allergy   ? Goiter   ? Headache(784.0)   ? Hypothyroid   ? Hypothyroidism, acquired, autoimmune   ? Migraine   ? Physical growth delay   ? Puberty delay   ? Thyroiditis, autoimmune   ? ?Past Surgical History: ?Past Surgical History:  ?Procedure Laterality Date  ? ADENOIDECTOMY    ? NASAL TURBINATE REDUCTION    ? TONSILECTOMY, ADENOIDECTOMY, BILATERAL MYRINGOTOMY AND TUBES    ? ?Social History: ?Social History  ? ?Socioeconomic History  ? Marital status: Single  ?  Spouse name: Not on file  ? Number of children: Not on file  ? Years of education: Not on file  ? Highest education level: Not on file  ?Occupational History  ? Not on file  ?Tobacco Use  ? Smoking status: Never  ? Smokeless tobacco: Never  ?Substance and Sexual Activity  ? Alcohol use: No  ? Drug use: Not on file  ? Sexual activity: Never  ?Other Topics Concern  ? Not on file  ?Social History Narrative  ? Not on file  ? ?Social Determinants of Health  ? ?Financial Resource Strain: Not on file  ?Food Insecurity: Not on file  ?Transportation Needs: Not on file  ?Physical Activity: Not on file  ?Stress: Not on file  ?Social Connections: Not on file  ? ?Family History: ?Family History  ?Problem Relation Age of Onset  ? Hypothyroidism Mother   ? Migraines Mother   ? Allergies Mother   ? Cancer Mother   ? Cancer Father   ? Allergies Father   ? Hypothyroidism Sister   ? Migraines Sister   ? Allergies Sister   ? Thyroid disease Sister   ? Thyroid disease Maternal Aunt   ? Cancer Maternal Grandmother   ? Thyroid disease Maternal Grandfather   ? ?Allergies: ?Allergies  ?Allergen Reactions  ?  Cyproheptadine   ? ?Medications: See med rec. ? ?Review of Systems: No headache, visual changes, nausea, vomiting, diarrhea, constipation, dizziness, abdominal pain, skin rash, fevers, chills, night sweats, swollen lymph nodes, weight loss, chest pain, body aches, joint swelling, muscle aches, shortness of breath, mood changes, visual or auditory hallucinations. ? ?Objective:   ? ?General: Well Developed, well nourished, and in no acute distress.  ?Neuro: Alert and oriented x3, extra-ocular muscles intact, sensation grossly intact. Cranial nerves II through XII are intact, motor, sensory, and coordinative functions are all intact. ?HEENT: Normocephalic, atraumatic, pupils equal round reactive to light, neck supple, no masses, no lymphadenopathy, thyroid nonpalpable. Oropharynx, nasopharynx, external ear canals are unremarkable. ?Skin: Warm and dry, no rashes noted.  ?Cardiac: Regular rate and rhythm, no murmurs rubs or gallops.  ?Respiratory: Clear to auscultation bilaterally. Not using accessory muscles, speaking in full sentences.  ?Abdominal: Soft, nontender, nondistended, positive bowel sounds, no masses, no organomegaly.  ?Musculoskeletal: Shoulder, elbow, wrist, hip, knee, ankle stable, and with full range of motion. ? ?Impression and Recommendations:   ? ?The patient was counselled, risk factors were discussed, anticipatory guidance given. ? ?Annual physical exam ?Annual physical as above. ?Healthy, happy male. ?Living in [place] right now. ?Due for Tdap, done today, routine  labs. ?Return to see me in a year. ? ?Hypothyroidism, acquired, autoimmune ?Historically well controlled, I will refill his levothyroxine once we see his TSH ? ? ?___________________________________________ ?Paul [name]. Benjamin Stain, M.D., ABFM., CAQSM. ?Primary Care and Sports Medicine ?[facility] ? ?Adjunct Professor of Family Medicine  ?[employer] ?

## 2021-05-22 LAB — COMPREHENSIVE METABOLIC PANEL
AG Ratio: 2.1 (calc) (ref 1.0–2.5)
ALT: 15 U/L (ref 9–46)
AST: 20 U/L (ref 10–40)
Albumin: 5.2 g/dL — ABNORMAL HIGH (ref 3.6–5.1)
Alkaline phosphatase (APISO): 69 U/L (ref 36–130)
BUN: 13 mg/dL (ref 7–25)
CO2: 29 mmol/L (ref 20–32)
Calcium: 10.3 mg/dL (ref 8.6–10.3)
Chloride: 102 mmol/L (ref 98–110)
Creat: 0.83 mg/dL (ref 0.60–1.24)
Globulin: 2.5 g/dL (calc) (ref 1.9–3.7)
Glucose, Bld: 97 mg/dL (ref 65–99)
Potassium: 4.5 mmol/L (ref 3.5–5.3)
Sodium: 139 mmol/L (ref 135–146)
Total Bilirubin: 0.8 mg/dL (ref 0.2–1.2)
Total Protein: 7.7 g/dL (ref 6.1–8.1)

## 2021-05-22 LAB — CBC
HCT: 43 % (ref 38.5–50.0)
Hemoglobin: 14.2 g/dL (ref 13.2–17.1)
MCH: 29.7 pg (ref 27.0–33.0)
MCHC: 33 g/dL (ref 32.0–36.0)
MCV: 90 fL (ref 80.0–100.0)
MPV: 8.8 fL (ref 7.5–12.5)
Platelets: 285 10*3/uL (ref 140–400)
RBC: 4.78 10*6/uL (ref 4.20–5.80)
RDW: 12.1 % (ref 11.0–15.0)
WBC: 5.7 10*3/uL (ref 3.8–10.8)

## 2021-05-22 LAB — LIPID PANEL
Cholesterol: 195 mg/dL (ref <200)
HDL: 72 mg/dL (ref >=40)
LDL Cholesterol (Calc): 107 mg/dL (calc) — ABNORMAL HIGH
Non-HDL Cholesterol (Calc): 123 mg/dL (calc) (ref <130)
Total CHOL/HDL Ratio: 2.7 (calc) (ref <5.0)
Triglycerides: 73 mg/dL (ref <150)

## 2021-05-22 LAB — TSH: TSH: 1.26 mIU/L (ref 0.40–4.50)

## 2021-06-07 ENCOUNTER — Other Ambulatory Visit: Payer: Self-pay | Admitting: Sports Medicine

## 2021-06-07 DIAGNOSIS — E063 Autoimmune thyroiditis: Secondary | ICD-10-CM

## 2021-08-25 ENCOUNTER — Ambulatory Visit (INDEPENDENT_AMBULATORY_CARE_PROVIDER_SITE_OTHER): Payer: BC Managed Care – PPO

## 2021-08-25 ENCOUNTER — Ambulatory Visit: Payer: BC Managed Care – PPO | Admitting: Sports Medicine

## 2021-08-25 DIAGNOSIS — M778 Other enthesopathies, not elsewhere classified: Secondary | ICD-10-CM | POA: Insufficient documentation

## 2021-08-25 DIAGNOSIS — M25531 Pain in right wrist: Secondary | ICD-10-CM

## 2021-08-25 DIAGNOSIS — G8929 Other chronic pain: Secondary | ICD-10-CM

## 2021-08-25 MED ORDER — MELOXICAM 15 MG PO TABS: ORAL_TABLET | ORAL | 3 refills | Status: DC

## 2021-08-25 NOTE — Assessment & Plan Note (Signed)
Pleasant 26 year old male, approximately 6 months ago he had a fall onto an outstretched hand, had some swelling and pain but never got x-rays. Pain was predominantly at the anatomical snuffbox. Now 6 months later continues to have pain anatomical snuffbox, mild intermittent swelling. On exam he has some tenderness distal radius and scaphoid, concern for chronic scaphoid versus scapholunate injury. Adding x-rays, meloxicam, because he has failed greater than 6 weeks of conservative treatment we will also proceed with MR arthrography looking for scapholunate versus other ligamentous injury.

## 2021-08-25 NOTE — Progress Notes (Signed)
    Procedures performed today:    None.  Independent interpretation of notes and tests performed by another provider:   None.  Brief History, Exam, Impression, and Recommendations:    Chronic pain of right wrist Pleasant 26 year old male, approximately 6 months ago he had a fall onto an outstretched hand, had some swelling and pain but never got x-rays. Pain was predominantly at the anatomical snuffbox. Now 6 months later continues to have pain anatomical snuffbox, mild intermittent swelling. On exam he has some tenderness distal radius and scaphoid, concern for chronic scaphoid versus scapholunate injury. Adding x-rays, meloxicam, because he has failed greater than 6 weeks of conservative treatment we will also proceed with MR arthrography looking for scapholunate versus other ligamentous injury.    ____________________________________________ Ihor Austin. Benjamin Stain, M.D., ABFM., CAQSM., AME. Primary Care and Sports Medicine [facility]  Adjunct Professor of Family Medicine  [place] [employer]  Restaurant manager, fast food

## 2021-09-08 ENCOUNTER — Ambulatory Visit (INDEPENDENT_AMBULATORY_CARE_PROVIDER_SITE_OTHER): Payer: BC Managed Care – PPO

## 2021-09-08 ENCOUNTER — Ambulatory Visit: Payer: BC Managed Care – PPO | Admitting: Sports Medicine

## 2021-09-08 DIAGNOSIS — G8929 Other chronic pain: Secondary | ICD-10-CM

## 2021-09-08 DIAGNOSIS — M25531 Pain in right wrist: Secondary | ICD-10-CM

## 2021-09-08 MED ORDER — GADOBUTROL 1 MMOL/ML IV SOLN
1.0000 mL | Freq: Once | INTRAVENOUS | Status: AC | PRN
  Administered 2021-09-08: 1 mL

## 2021-09-08 NOTE — Progress Notes (Signed)
    Procedures performed today:    Procedure: Real-time Ultrasound Guided gadolinium contrast injection of right radiocarpal joint Device: Samsung HS60  Verbal informed consent obtained.  Time-out conducted.  Noted no overlying erythema, induration, or other signs of local infection.  Skin prepped in a sterile fashion.  Local anesthesia: Topical Ethyl chloride.  With sterile technique and under real time ultrasound guidance: Noted trace effusion, 1 cc lidocaine, 1 cc kenalog 40 injected easily, syringe switched and 0.05 cc gadolinium injected, syringe again switched and 1 to 2 mL sterile saline used to fully distend the joint. Joint visualized and capsule seen distending confirming intra-articular placement of contrast material and medication. Completed without difficulty  Advised to call if fevers/chills, erythema, induration, drainage, or persistent bleeding.  Images permanently stored in PACS Impression: Technically successful ultrasound guided gadolinium contrast injection for MR arthrography.  Please see separate MR arthrogram report.  Independent interpretation of notes and tests performed by another provider:   None.  Brief History, Exam, Impression, and Recommendations:    Chronic pain of right wrist Today we performed the injection for MR arthrography, please see prior notes for clinical history.   ____________________________________________ Paul [name]. Benjamin Stain, M.D., ABFM., CAQSM., AME. Primary Care and Sports Medicine [facility]  Adjunct Professor of Family Medicine  [place] [employer]  Restaurant manager, fast food

## 2021-09-08 NOTE — Assessment & Plan Note (Signed)
Today we performed the injection for MR arthrography, please see prior notes for clinical history.

## 2021-12-08 ENCOUNTER — Ambulatory Visit (INDEPENDENT_AMBULATORY_CARE_PROVIDER_SITE_OTHER): Payer: BC Managed Care – PPO | Admitting: Sports Medicine

## 2021-12-08 DIAGNOSIS — M778 Other enthesopathies, not elsewhere classified: Secondary | ICD-10-CM | POA: Diagnosis not present

## 2021-12-08 MED ORDER — NITROGLYCERIN 0.2 MG/HR TD PT24: MEDICATED_PATCH | TRANSDERMAL | 11 refills | Status: DC

## 2021-12-08 NOTE — Progress Notes (Signed)
    Procedures performed today:    None.  Independent interpretation of notes and tests performed by another provider:   None.  Brief History, Exam, Impression, and Recommendations:    Extensor carpi ulnaris tendon tear, right Pleasant 26 year old male, plays golf and basketball, has had several months of pain at right wrist, ultimately we performed MR arthrography back in August that showed a split tear of the extensor carpi ulnaris, no other pathology noted. He has done okay but still has some vague discomfort. He will get a wrist sleeve, adding topical nitroglycerin and home physical therapy for the extensor carpi ulnaris. His girlfriend is a physical therapist and can help him out. He understands that the split tear in the tendon will likely not fully heal but the hope is to get it to scar down with the above treatments and stop hurting. If insufficient improvement over the next 4 to 6 weeks we will proceed with ECU injection followed by referral to hand surgery if not better.   ____________________________________________ Ihor Austin. Benjamin Stain, M.D., ABFM., CAQSM., AME. Primary Care and Sports Medicine [facility]  Adjunct Professor of Family Medicine  [place] [employer] [facility] of Medicine  Restaurant manager, fast food

## 2021-12-08 NOTE — Assessment & Plan Note (Signed)
Pleasant 26 year old male, plays golf and basketball, has had several months of pain at right wrist, ultimately we performed MR arthrography back in August that showed a split tear of the extensor carpi ulnaris, no other pathology noted. He has done okay but still has some vague discomfort. He will get a wrist sleeve, adding topical nitroglycerin and home physical therapy for the extensor carpi ulnaris. His girlfriend is a physical therapist and can help him out. He understands that the split tear in the tendon will likely not fully heal but the hope is to get it to scar down with the above treatments and stop hurting. If insufficient improvement over the next 4 to 6 weeks we will proceed with ECU injection followed by referral to hand surgery if not better.

## 2022-01-05 ENCOUNTER — Ambulatory Visit: Payer: BC Managed Care – PPO | Admitting: Sports Medicine

## 2022-01-21 DIAGNOSIS — R112 Nausea with vomiting, unspecified: Secondary | ICD-10-CM | POA: Diagnosis not present

## 2022-01-21 DIAGNOSIS — R634 Abnormal weight loss: Secondary | ICD-10-CM | POA: Diagnosis not present

## 2022-01-21 DIAGNOSIS — R194 Change in bowel habit: Secondary | ICD-10-CM | POA: Diagnosis not present

## 2022-01-21 DIAGNOSIS — R1013 Epigastric pain: Secondary | ICD-10-CM | POA: Diagnosis not present

## 2022-01-29 ENCOUNTER — Telehealth (INDEPENDENT_AMBULATORY_CARE_PROVIDER_SITE_OTHER): Payer: BC Managed Care – PPO | Admitting: Sports Medicine

## 2022-01-29 DIAGNOSIS — E063 Autoimmune thyroiditis: Secondary | ICD-10-CM | POA: Diagnosis not present

## 2022-01-29 DIAGNOSIS — F419 Anxiety disorder, unspecified: Secondary | ICD-10-CM | POA: Diagnosis not present

## 2022-01-29 DIAGNOSIS — F32A Depression, unspecified: Secondary | ICD-10-CM

## 2022-01-29 MED ORDER — SERTRALINE HCL 25 MG PO TABS: 25.0000 mg | ORAL_TABLET | Freq: Every day | ORAL | 2 refills | Status: DC

## 2022-01-29 NOTE — Assessment & Plan Note (Signed)
Historically really well-controlled, Breckan is having some increasing anxiety and depressive symptoms though I do think we need to recheck his thyroid function and some labs to rule out a physiologic cause. He would like the order sent to [facility] in [place].

## 2022-01-29 NOTE — Progress Notes (Signed)
   Virtual Visit via WebEx/MyChart   I connected with  Paul [name]  on 01/29/22 via WebEx/MyChart/Doximity Video and verified that I am speaking with the correct person using two identifiers.   I discussed the limitations, risks, security and privacy concerns of performing an evaluation and management service by WebEx/MyChart/Doximity Video, including the higher likelihood of inaccurate diagnosis and treatment, and the availability of in person appointments.  We also discussed the likely need of an additional face to face encounter for complete and high quality delivery of care.  I also discussed with the patient that there may be a patient responsible charge related to this service. The patient expressed understanding and wishes to proceed.  Provider location is in medical facility. Patient location is at their home, different from provider location. [name] involved in care of the patient during this telehealth encounter were myself, my nurse/medical assistant, and my front office/scheduling team member.  Review of Systems: No fevers, chills, night sweats, weight loss, chest pain, or shortness of breath.   Objective Findings:    General: Speaking full sentences, no audible heavy breathing.  Sounds alert and appropriately interactive.  Appears well.  Face symmetric.  Extraocular movements intact.  Pupils equal and round.  No nasal flaring or accessory muscle use visualized.  Independent interpretation of tests performed by another provider:   None.  Brief History, Exam, Impression, and Recommendations:    Hypothyroidism, acquired, autoimmune Historically really well-controlled, Paul [name] is having some increasing anxiety and depressive symptoms though I do think we need to recheck his thyroid function and some labs to rule out a physiologic cause. He would like the order sent to [facility] [place] in [place].  Anxiety and depression For the past year has noted increased anxiety and depressive  symptoms, anhedonia, lack of drive and energy. He has had about a 10 pound weight loss, no suicidal or homicidal ideation. We discussed the pathophysiology of anxiety and depression, he is interested in pharmacotherapy and behavioral therapy, adding Zoloft 25, will do a 4 to 6-week follow-up for PHQ and GAD, declines benzodiazepine rescue therapy. He is going to look up behavioral therapist and send me a message to let me know who he would like to see.  I discussed the above assessment and treatment plan with the patient. The patient was provided an opportunity to ask questions and all were answered. The patient agreed with the plan and demonstrated an understanding of the instructions.   The patient was advised to call back or seek an in-person evaluation if the symptoms worsen or if the condition fails to improve as anticipated.   I provided 30 minutes of face to face and non-face-to-face time during this encounter date, time was needed to gather information, review chart, records, communicate/coordinate with staff remotely, as well as complete documentation.   ____________________________________________ Gwen Her. Dianah Field, M.D., ABFM., CAQSM., AME. Primary Care and Sports Medicine [facility]  Adjunct Professor of [place] of [facility] [employer] of Medicine  Risk manager

## 2022-01-29 NOTE — Assessment & Plan Note (Signed)
For the past year has noted increased anxiety and depressive symptoms, anhedonia, lack of drive and energy. He has had about a 10 pound weight loss, no suicidal or homicidal ideation. We discussed the pathophysiology of anxiety and depression, he is interested in pharmacotherapy and behavioral therapy, adding Zoloft 25, will do a 4 to 6-week follow-up for PHQ and GAD, declines benzodiazepine rescue therapy. He is going to look up behavioral therapist and send me a message to let me know who he would like to see.

## 2022-02-20 ENCOUNTER — Other Ambulatory Visit: Payer: Self-pay | Admitting: Sports Medicine

## 2022-02-20 DIAGNOSIS — F419 Anxiety disorder, unspecified: Secondary | ICD-10-CM

## 2022-04-06 DIAGNOSIS — R634 Abnormal weight loss: Secondary | ICD-10-CM | POA: Diagnosis not present

## 2022-04-06 DIAGNOSIS — R194 Change in bowel habit: Secondary | ICD-10-CM | POA: Diagnosis not present

## 2022-04-06 DIAGNOSIS — R112 Nausea with vomiting, unspecified: Secondary | ICD-10-CM | POA: Diagnosis not present

## 2022-04-21 DIAGNOSIS — R1010 Upper abdominal pain, unspecified: Secondary | ICD-10-CM | POA: Diagnosis not present

## 2022-04-21 DIAGNOSIS — R634 Abnormal weight loss: Secondary | ICD-10-CM | POA: Diagnosis not present

## 2022-04-21 DIAGNOSIS — R112 Nausea with vomiting, unspecified: Secondary | ICD-10-CM | POA: Diagnosis not present

## 2022-04-22 DIAGNOSIS — K259 Gastric ulcer, unspecified as acute or chronic, without hemorrhage or perforation: Secondary | ICD-10-CM | POA: Diagnosis not present

## 2022-04-22 DIAGNOSIS — R112 Nausea with vomiting, unspecified: Secondary | ICD-10-CM | POA: Diagnosis not present

## 2022-04-22 DIAGNOSIS — R634 Abnormal weight loss: Secondary | ICD-10-CM | POA: Diagnosis not present

## 2022-04-22 DIAGNOSIS — R1013 Epigastric pain: Secondary | ICD-10-CM | POA: Diagnosis not present

## 2022-04-22 DIAGNOSIS — R1010 Upper abdominal pain, unspecified: Secondary | ICD-10-CM | POA: Diagnosis not present

## 2022-04-22 DIAGNOSIS — R6881 Early satiety: Secondary | ICD-10-CM | POA: Diagnosis not present

## 2022-05-01 ENCOUNTER — Other Ambulatory Visit: Payer: Self-pay | Admitting: Sports Medicine

## 2022-05-01 DIAGNOSIS — F32A Depression, unspecified: Secondary | ICD-10-CM

## 2022-05-03 DIAGNOSIS — R112 Nausea with vomiting, unspecified: Secondary | ICD-10-CM | POA: Diagnosis not present

## 2022-05-03 DIAGNOSIS — R1013 Epigastric pain: Secondary | ICD-10-CM | POA: Diagnosis not present

## 2022-05-25 ENCOUNTER — Encounter: Payer: BC Managed Care – PPO | Admitting: Sports Medicine

## 2022-05-27 ENCOUNTER — Other Ambulatory Visit: Payer: Self-pay | Admitting: Sports Medicine

## 2022-05-27 DIAGNOSIS — F32A Depression, unspecified: Secondary | ICD-10-CM

## 2022-06-09 ENCOUNTER — Encounter: Payer: BC Managed Care – PPO | Admitting: Sports Medicine

## 2022-07-14 ENCOUNTER — Other Ambulatory Visit: Payer: Self-pay | Admitting: Sports Medicine

## 2022-07-14 DIAGNOSIS — E063 Autoimmune thyroiditis: Secondary | ICD-10-CM

## 2022-07-26 ENCOUNTER — Other Ambulatory Visit: Payer: Self-pay | Admitting: Sports Medicine

## 2022-07-26 DIAGNOSIS — F32A Depression, unspecified: Secondary | ICD-10-CM

## 2022-08-11 ENCOUNTER — Telehealth: Payer: BC Managed Care – PPO | Admitting: Sports Medicine

## 2022-08-11 DIAGNOSIS — F419 Anxiety disorder, unspecified: Secondary | ICD-10-CM | POA: Diagnosis not present

## 2022-08-11 DIAGNOSIS — E063 Autoimmune thyroiditis: Secondary | ICD-10-CM | POA: Diagnosis not present

## 2022-08-11 DIAGNOSIS — F32A Depression, unspecified: Secondary | ICD-10-CM | POA: Diagnosis not present

## 2022-08-11 MED ORDER — SERTRALINE HCL 50 MG PO TABS: 50.0000 mg | ORAL_TABLET | Freq: Every day | ORAL | 11 refills | Status: DC

## 2022-08-11 NOTE — Assessment & Plan Note (Signed)
This is a very pleasant 27 year old male, he has had increasing anxiety and depressive symptoms with anhedonia, lack of drive and lack of energy, he did note a 10 pound weight loss, ultimately was seen by gastroenterology, an upper endoscopy showed some gastric erosions, he is on Prilosec and doing better. He was supposed to follow-up with me sometime in February or early March, he continues with Zoloft 25. As he is continuing to have persistent symptoms without suicidal or homicidal ideation we will bump his Zoloft up to 50 mg daily, will do a 6-week follow-up.

## 2022-08-11 NOTE — Assessment & Plan Note (Signed)
Has historically been on Synthroid 25 mcg, more recently his pharmacy switched him to levothyroxine generic, it is over $100 per month cheaper, I explained to him the concept of a narrow therapeutic index drug and the difference in efficacy between generics and brand-name drugs, I am okay with him continuing to take generic levothyroxine but we do need to check his TSH again in about 4 and half weeks. I will place the orders for [facility].

## 2022-08-11 NOTE — Progress Notes (Signed)
   Virtual Visit via WebEx/MyChart   I connected with  Paul [name]  on 08/11/22 via WebEx/MyChart/Doximity Video and verified that I am speaking with the correct person using two identifiers.   I discussed the limitations, risks, security and privacy concerns of performing an evaluation and management service by WebEx/MyChart/Doximity Video, including the higher likelihood of inaccurate diagnosis and treatment, and the availability of in person appointments.  We also discussed the likely need of an additional face to face encounter for complete and high quality delivery of care.  I also discussed with the patient that there may be a patient responsible charge related to this service. The patient expressed understanding and wishes to proceed.  Provider location is in medical facility. Patient location is at their home, different from provider location. People involved in care of the patient during this telehealth encounter were myself, my nurse/medical assistant, and my front office/scheduling team member.  Review of Systems: No fevers, chills, night sweats, weight loss, chest pain, or shortness of breath.   Objective Findings:    General: Speaking full sentences, no audible heavy breathing.  Sounds alert and appropriately interactive.  Appears well.  Face symmetric.  Extraocular movements intact.  Pupils equal and round.  No nasal flaring or accessory muscle use visualized.  Independent interpretation of tests performed by another provider:   None.  Brief History, Exam, Impression, and Recommendations:    Anxiety and depression This is a very pleasant 27 year old male, he has had increasing anxiety and depressive symptoms with anhedonia, lack of drive and lack of energy, he did note a 10 pound weight loss, ultimately was seen by gastroenterology, an upper endoscopy showed some gastric erosions, he is on Prilosec and doing better. He was supposed to follow-up with me sometime in  February or early March, he continues with Zoloft 25. As he is continuing to have persistent symptoms without suicidal or homicidal ideation we will bump his Zoloft up to 50 mg daily, will do a 6-week follow-up.  Hypothyroidism, acquired, autoimmune Has historically been on Synthroid 25 mcg, more recently his pharmacy switched him to levothyroxine generic, it is over $100 per month cheaper, I explained to him the concept of a narrow therapeutic index drug and the difference in efficacy between generics and brand-name drugs, I am okay with him continuing to take generic levothyroxine but we do need to check his TSH again in about 4 and half weeks. I will place the orders for [facility].   I discussed the above assessment and treatment plan with the patient. The patient was provided an opportunity to ask questions and all were answered. The patient agreed with the plan and demonstrated an understanding of the instructions.   The patient was advised to call back or seek an in-person evaluation if the symptoms worsen or if the condition fails to improve as anticipated.   I provided 30 minutes of face to face and non-face-to-face time during this encounter date, time was needed to gather information, review chart, records, communicate/coordinate with staff remotely, as well as complete documentation.   ____________________________________________ Ihor Austin. Benjamin Stain, M.D., ABFM., CAQSM., AME. Primary Care and Sports Medicine [facility]  Adjunct Professor of Family Medicine  [employer]  Restaurant manager, fast food

## 2022-08-25 ENCOUNTER — Other Ambulatory Visit: Payer: Self-pay | Admitting: Sports Medicine

## 2022-08-25 DIAGNOSIS — F32A Anxiety disorder, unspecified: Secondary | ICD-10-CM

## 2022-10-27 ENCOUNTER — Encounter (INDEPENDENT_AMBULATORY_CARE_PROVIDER_SITE_OTHER): Payer: BC Managed Care – PPO | Admitting: Sports Medicine

## 2022-10-27 DIAGNOSIS — F32A Depression, unspecified: Secondary | ICD-10-CM

## 2022-10-27 DIAGNOSIS — F419 Anxiety disorder, unspecified: Secondary | ICD-10-CM | POA: Diagnosis not present

## 2022-10-28 DIAGNOSIS — Z8711 Personal history of peptic ulcer disease: Secondary | ICD-10-CM | POA: Diagnosis not present

## 2022-10-28 DIAGNOSIS — F419 Anxiety disorder, unspecified: Secondary | ICD-10-CM | POA: Diagnosis not present

## 2022-10-28 DIAGNOSIS — R0789 Other chest pain: Secondary | ICD-10-CM | POA: Diagnosis not present

## 2022-10-28 DIAGNOSIS — R1030 Lower abdominal pain, unspecified: Secondary | ICD-10-CM | POA: Diagnosis not present

## 2022-10-28 MED ORDER — ALPRAZOLAM 0.5 MG PO TABS
ORAL_TABLET | ORAL | 0 refills | Status: DC
Start: 2022-10-28 — End: 2023-09-07

## 2022-10-28 NOTE — Telephone Encounter (Signed)

## 2022-11-19 DIAGNOSIS — K30 Functional dyspepsia: Secondary | ICD-10-CM | POA: Diagnosis not present

## 2022-11-19 DIAGNOSIS — D649 Anemia, unspecified: Secondary | ICD-10-CM | POA: Diagnosis not present

## 2023-05-05 ENCOUNTER — Other Ambulatory Visit: Payer: Self-pay | Admitting: Sports Medicine

## 2023-05-05 DIAGNOSIS — E063 Autoimmune thyroiditis: Secondary | ICD-10-CM

## 2023-05-25 DIAGNOSIS — M9905 Segmental and somatic dysfunction of pelvic region: Secondary | ICD-10-CM | POA: Diagnosis not present

## 2023-05-25 DIAGNOSIS — R0782 Intercostal pain: Secondary | ICD-10-CM | POA: Diagnosis not present

## 2023-05-25 DIAGNOSIS — R0781 Pleurodynia: Secondary | ICD-10-CM | POA: Diagnosis not present

## 2023-05-25 DIAGNOSIS — M545 Low back pain, unspecified: Secondary | ICD-10-CM | POA: Diagnosis not present

## 2023-05-25 DIAGNOSIS — M60852 Other myositis, left thigh: Secondary | ICD-10-CM | POA: Diagnosis not present

## 2023-05-25 DIAGNOSIS — M9902 Segmental and somatic dysfunction of thoracic region: Secondary | ICD-10-CM | POA: Diagnosis not present

## 2023-05-25 DIAGNOSIS — M9903 Segmental and somatic dysfunction of lumbar region: Secondary | ICD-10-CM | POA: Diagnosis not present

## 2023-05-25 DIAGNOSIS — M9908 Segmental and somatic dysfunction of rib cage: Secondary | ICD-10-CM | POA: Diagnosis not present

## 2023-05-30 ENCOUNTER — Ambulatory Visit: Admitting: Sports Medicine

## 2023-09-06 ENCOUNTER — Other Ambulatory Visit: Payer: Self-pay | Admitting: Sports Medicine

## 2023-09-06 DIAGNOSIS — E063 Autoimmune thyroiditis: Secondary | ICD-10-CM

## 2023-09-06 NOTE — Telephone Encounter (Unsigned)
 Copied from CRM [id number]. Topic: Clinical - Medication Refill >> Sep 06, 2023 12:35 PM Vivian Z wrote: Medication: SYNTHROID  25 MCG tablet  Has the patient contacted their pharmacy? Yes (Agent: If no, request that the patient contact the pharmacy for the refill. If patient does not wish to contact the pharmacy document the reason why and proceed with request.) (Agent: If yes, when and what did the pharmacy advise?)  This is the patient's preferred pharmacy:  [id number] [address], [place] - [address] [facility] [place] [address] Phone: [phone] Fax: [phone]  Is this the correct pharmacy for this prescription? Yes If no, delete pharmacy and type the correct one.   Has the prescription been filled recently? No  Is the patient out of the medication? Yes  Has the patient been seen for an appointment in the last year OR does the patient have an upcoming appointment? Yes  Can we respond through MyChart? No  Agent: Please be advised that Rx refills may take up to 3 business days. We ask that you follow-up with your pharmacy.

## 2023-09-07 ENCOUNTER — Encounter: Payer: Self-pay | Admitting: Sports Medicine

## 2023-09-07 ENCOUNTER — Other Ambulatory Visit: Payer: Self-pay | Admitting: Sports Medicine

## 2023-09-07 ENCOUNTER — Telehealth (INDEPENDENT_AMBULATORY_CARE_PROVIDER_SITE_OTHER): Admitting: Sports Medicine

## 2023-09-07 DIAGNOSIS — F32A Depression, unspecified: Secondary | ICD-10-CM | POA: Diagnosis not present

## 2023-09-07 DIAGNOSIS — E063 Autoimmune thyroiditis: Secondary | ICD-10-CM

## 2023-09-07 DIAGNOSIS — F419 Anxiety disorder, unspecified: Secondary | ICD-10-CM

## 2023-09-07 MED ORDER — BUSPIRONE HCL 5 MG PO TABS: 5.0000 mg | ORAL_TABLET | Freq: Two times a day (BID) | ORAL | 3 refills | Status: DC

## 2023-09-07 MED ORDER — ALPRAZOLAM 0.5 MG PO TABS
ORAL_TABLET | ORAL | 0 refills | Status: AC
Start: 2023-09-07 — End: ?

## 2023-09-07 MED ORDER — SYNTHROID 25 MCG PO TABS
25.0000 ug | ORAL_TABLET | Freq: Every day | ORAL | 3 refills | Status: DC
Start: 2023-09-07 — End: 2023-09-07

## 2023-09-07 NOTE — Progress Notes (Signed)
   Virtual Visit via WebEx/MyChart   I connected with  Paul [name]  on 09/07/23 via WebEx/MyChart/Doximity Video and verified that I am speaking with the correct person using two identifiers.   I discussed the limitations, risks, security and privacy concerns of performing an evaluation and management service by WebEx/MyChart/Doximity Video, including the higher likelihood of inaccurate diagnosis and treatment, and the availability of in person appointments.  We also discussed the likely need of an additional face to face encounter for complete and high quality delivery of care.  I also discussed with the patient that there may be a patient responsible charge related to this service. The patient expressed understanding and wishes to proceed.  Provider location is in medical facility. Patient location is at their home, different from provider location. People involved in care of the patient during this telehealth encounter were myself, my nurse/medical assistant, and my front office/scheduling team member.  Review of Systems: No fevers, chills, night sweats, weight loss, chest pain, or shortness of breath.   Objective Findings:    General: Speaking full sentences, no audible heavy breathing.  Sounds alert and appropriately interactive.  Appears well.  Face symmetric.  Extraocular movements intact.  Pupils equal and round.  No nasal flaring or accessory muscle use visualized.  Independent interpretation of tests performed by another provider:   None.  Brief History, Exam, Impression, and Recommendations:    Anxiety and depression with flight phobia It was good to talk to Paul [name] again, he does have a history of baseline anxiety, we did some Zoloft , unfortunately he did not note tremendous efficacy and it did create some sexual dysfunction so he self discontinued. He does have some alprazolam  that he only uses intermittently before flights. We discussed other measures to control anxiety  including buspirone , exercise therapy and behavioral therapy, he will commit to some form of exercise based on the [employer] sports medicine recommendations. He will also let him know if he like to try buspirone  and let me know if he needs a referral to see a therapist locally in [place].  Hypothyroidism, acquired, autoimmune Known hypothyroidism, stable on Synthroid , he has not had labs done in almost 2 years although we we did order some last year. He will let me know of the [facility] in [place] that he would like to go to and we can send his orders there. Planning CBC, CMP, TSH, lipid panel, A1c.  I discussed the above assessment and treatment plan with the patient. The patient was provided an opportunity to ask questions and all were answered. The patient agreed with the plan and demonstrated an understanding of the instructions.   The patient was advised to call back or seek an in-person evaluation if the symptoms worsen or if the condition fails to improve as anticipated.   I provided 30 minutes of face to face and non-face-to-face time during this encounter date, time was needed to gather information, review chart, records, communicate/coordinate with staff remotely, as well as complete documentation.   ____________________________________________ Debby PARAS. Curtis, M.D., ABFM., CAQSM., AME. Primary Care and Sports Medicine  [facility]  Adjunct Professor of [facility] Medicine  [employer]  Restaurant manager, fast food

## 2023-09-07 NOTE — Assessment & Plan Note (Signed)
 Known hypothyroidism, stable on Synthroid , he has not had labs done in almost 2 years although we we did order some last year. He will let me know of the [facility] in [place] that he would like to go to and we can send his orders there. Planning CBC, CMP, TSH, lipid panel, A1c.

## 2023-09-07 NOTE — Telephone Encounter (Signed)
 Ok lab orders placed and can be faxed.

## 2023-09-07 NOTE — Telephone Encounter (Signed)
 Spoke with [facility] service center in [place]. They require the patient to bring in the paper requisition at the time of the lab draw. Please place the orders and they can be mailed to the patient.

## 2023-09-07 NOTE — Assessment & Plan Note (Signed)
 It was good to talk to Medhansh again, he does have a history of baseline anxiety, we did some Zoloft , unfortunately he did not note tremendous efficacy and it did create some sexual dysfunction so he self discontinued. He does have some alprazolam  that he only uses intermittently before flights. We discussed other measures to control anxiety including buspirone , exercise therapy and behavioral therapy, he will commit to some form of exercise based on the [employer] sports medicine recommendations. He will also let him know if he like to try buspirone  and let me know if he needs a referral to see a therapist locally in [place].

## 2023-09-20 ENCOUNTER — Encounter: Payer: Self-pay | Admitting: Sports Medicine

## 2023-09-20 NOTE — Telephone Encounter (Signed)
 Hey [name], Do you remember this patient? Apparently Dr. ONEIDA wrote lab orders and you mailed to his home, but he has not yet received. Were they sent certified? If not, are we able to upload for him on Mychart, and he can just print them himself?

## 2023-09-20 NOTE — Telephone Encounter (Signed)
 Covering provider was changed to [employer].

## 2023-10-05 ENCOUNTER — Other Ambulatory Visit: Payer: Self-pay

## 2023-10-05 DIAGNOSIS — F419 Anxiety disorder, unspecified: Secondary | ICD-10-CM

## 2023-10-05 MED ORDER — BUSPIRONE HCL 5 MG PO TABS: 5.0000 mg | ORAL_TABLET | Freq: Two times a day (BID) | ORAL | 0 refills | Status: DC

## 2024-01-04 ENCOUNTER — Other Ambulatory Visit: Payer: Self-pay | Admitting: Family Medicine

## 2024-01-04 DIAGNOSIS — F419 Anxiety disorder, unspecified: Secondary | ICD-10-CM
